# Patient Record
Sex: Female | Born: 1975 | Race: Black or African American | Hispanic: No | Marital: Married | Smoking: Never smoker
Health system: Southern US, Community
[De-identification: ages and names within clinical notes are randomized; demographics above are authoritative.]

## PROBLEM LIST (undated history)

## (undated) DIAGNOSIS — M1990 Unspecified osteoarthritis, unspecified site: Secondary | ICD-10-CM

## (undated) DIAGNOSIS — I10 Essential (primary) hypertension: Secondary | ICD-10-CM

---

## 1997-11-17 ENCOUNTER — Inpatient Hospital Stay (HOSPITAL_COMMUNITY): Admission: AD | Admit: 1997-11-17 | Discharge: 1997-11-17 | Payer: Self-pay | Admitting: Obstetrics

## 1998-01-20 ENCOUNTER — Ambulatory Visit (HOSPITAL_COMMUNITY): Admission: RE | Admit: 1998-01-20 | Discharge: 1998-01-20 | Payer: Self-pay | Admitting: Obstetrics & Gynecology

## 1998-02-02 ENCOUNTER — Encounter: Admission: RE | Admit: 1998-02-02 | Discharge: 1998-02-02 | Payer: Self-pay | Admitting: Family Medicine

## 1998-03-01 ENCOUNTER — Encounter: Admission: RE | Admit: 1998-03-01 | Discharge: 1998-03-01 | Payer: Self-pay | Admitting: Family Medicine

## 1998-03-18 ENCOUNTER — Encounter: Admission: RE | Admit: 1998-03-18 | Discharge: 1998-03-18 | Payer: Self-pay | Admitting: Family Medicine

## 1998-03-25 ENCOUNTER — Ambulatory Visit (HOSPITAL_COMMUNITY): Admission: RE | Admit: 1998-03-25 | Discharge: 1998-03-25 | Payer: Self-pay

## 1998-04-06 ENCOUNTER — Encounter: Admission: RE | Admit: 1998-04-06 | Discharge: 1998-04-06 | Payer: Self-pay | Admitting: Family Medicine

## 1998-04-20 ENCOUNTER — Encounter: Admission: RE | Admit: 1998-04-20 | Discharge: 1998-04-20 | Payer: Self-pay | Admitting: Family Medicine

## 1998-04-26 ENCOUNTER — Encounter: Admission: RE | Admit: 1998-04-26 | Discharge: 1998-04-26 | Payer: Self-pay | Admitting: Family Medicine

## 1998-05-04 ENCOUNTER — Encounter: Admission: RE | Admit: 1998-05-04 | Discharge: 1998-05-04 | Payer: Self-pay | Admitting: Family Medicine

## 1998-05-13 ENCOUNTER — Encounter: Admission: RE | Admit: 1998-05-13 | Discharge: 1998-05-13 | Payer: Self-pay | Admitting: Family Medicine

## 1998-05-16 ENCOUNTER — Encounter: Admission: RE | Admit: 1998-05-16 | Discharge: 1998-05-16 | Payer: Self-pay | Admitting: Family Medicine

## 1998-05-16 ENCOUNTER — Inpatient Hospital Stay (HOSPITAL_COMMUNITY): Admission: AD | Admit: 1998-05-16 | Discharge: 1998-05-16 | Payer: Self-pay | Admitting: Obstetrics

## 1998-05-18 ENCOUNTER — Encounter: Admission: RE | Admit: 1998-05-18 | Discharge: 1998-05-18 | Payer: Self-pay | Admitting: Family Medicine

## 1998-05-18 ENCOUNTER — Inpatient Hospital Stay (HOSPITAL_COMMUNITY): Admission: AD | Admit: 1998-05-18 | Discharge: 1998-05-24 | Payer: Self-pay | Admitting: Obstetrics

## 1998-05-25 ENCOUNTER — Encounter: Admission: RE | Admit: 1998-05-25 | Discharge: 1998-05-25 | Payer: Self-pay | Admitting: Family Medicine

## 1998-07-01 ENCOUNTER — Encounter: Admission: RE | Admit: 1998-07-01 | Discharge: 1998-07-01 | Payer: Self-pay | Admitting: Family Medicine

## 1998-07-22 ENCOUNTER — Encounter: Admission: RE | Admit: 1998-07-22 | Discharge: 1998-07-22 | Payer: Self-pay | Admitting: Family Medicine

## 1998-07-22 ENCOUNTER — Other Ambulatory Visit: Admission: RE | Admit: 1998-07-22 | Discharge: 1998-07-22 | Payer: Self-pay

## 1998-08-11 ENCOUNTER — Encounter: Admission: RE | Admit: 1998-08-11 | Discharge: 1998-08-11 | Payer: Self-pay | Admitting: Family Medicine

## 1999-10-05 ENCOUNTER — Encounter: Admission: RE | Admit: 1999-10-05 | Discharge: 1999-10-05 | Payer: Self-pay | Admitting: Family Medicine

## 2000-09-25 ENCOUNTER — Encounter: Admission: RE | Admit: 2000-09-25 | Discharge: 2000-09-25 | Payer: Self-pay | Admitting: Family Medicine

## 2001-07-24 ENCOUNTER — Encounter: Admission: RE | Admit: 2001-07-24 | Discharge: 2001-07-24 | Payer: Self-pay | Admitting: Family Medicine

## 2001-07-30 ENCOUNTER — Encounter: Admission: RE | Admit: 2001-07-30 | Discharge: 2001-07-30 | Payer: Self-pay | Admitting: Family Medicine

## 2001-09-26 ENCOUNTER — Encounter: Admission: RE | Admit: 2001-09-26 | Discharge: 2001-09-26 | Payer: Self-pay | Admitting: Family Medicine

## 2001-11-14 ENCOUNTER — Encounter: Admission: RE | Admit: 2001-11-14 | Discharge: 2001-11-14 | Payer: Self-pay | Admitting: Family Medicine

## 2002-03-23 ENCOUNTER — Encounter: Admission: RE | Admit: 2002-03-23 | Discharge: 2002-03-23 | Payer: Self-pay | Admitting: Family Medicine

## 2002-03-25 ENCOUNTER — Encounter: Admission: RE | Admit: 2002-03-25 | Discharge: 2002-03-25 | Payer: Self-pay | Admitting: Family Medicine

## 2002-04-24 ENCOUNTER — Encounter: Admission: RE | Admit: 2002-04-24 | Discharge: 2002-04-24 | Payer: Self-pay | Admitting: Family Medicine

## 2002-05-12 ENCOUNTER — Encounter: Payer: Self-pay | Admitting: Sports Medicine

## 2002-05-12 ENCOUNTER — Encounter: Admission: RE | Admit: 2002-05-12 | Discharge: 2002-05-12 | Payer: Self-pay | Admitting: *Deleted

## 2002-05-12 ENCOUNTER — Encounter: Admission: RE | Admit: 2002-05-12 | Discharge: 2002-05-12 | Payer: Self-pay | Admitting: Sports Medicine

## 2002-05-15 ENCOUNTER — Encounter: Admission: RE | Admit: 2002-05-15 | Discharge: 2002-05-15 | Payer: Self-pay | Admitting: Family Medicine

## 2003-04-07 ENCOUNTER — Other Ambulatory Visit: Admission: RE | Admit: 2003-04-07 | Discharge: 2003-04-07 | Payer: Self-pay | Admitting: Family Medicine

## 2003-04-07 ENCOUNTER — Encounter: Admission: RE | Admit: 2003-04-07 | Discharge: 2003-04-07 | Payer: Self-pay | Admitting: Family Medicine

## 2003-04-08 ENCOUNTER — Encounter: Admission: RE | Admit: 2003-04-08 | Discharge: 2003-04-08 | Payer: Self-pay | Admitting: Family Medicine

## 2003-09-03 ENCOUNTER — Encounter: Admission: RE | Admit: 2003-09-03 | Discharge: 2003-09-03 | Payer: Self-pay | Admitting: Sports Medicine

## 2003-09-06 ENCOUNTER — Encounter: Admission: RE | Admit: 2003-09-06 | Discharge: 2003-09-06 | Payer: Self-pay | Admitting: Family Medicine

## 2004-08-01 ENCOUNTER — Ambulatory Visit: Payer: Self-pay | Admitting: Sports Medicine

## 2004-08-03 ENCOUNTER — Ambulatory Visit: Payer: Self-pay | Admitting: Family Medicine

## 2004-12-15 ENCOUNTER — Ambulatory Visit: Payer: Self-pay | Admitting: Family Medicine

## 2005-02-23 ENCOUNTER — Emergency Department (HOSPITAL_COMMUNITY): Admission: EM | Admit: 2005-02-23 | Discharge: 2005-02-23 | Payer: Self-pay | Admitting: Family Medicine

## 2005-05-18 ENCOUNTER — Ambulatory Visit: Payer: Self-pay | Admitting: Family Medicine

## 2005-05-18 ENCOUNTER — Other Ambulatory Visit: Admission: RE | Admit: 2005-05-18 | Discharge: 2005-05-18 | Payer: Self-pay | Admitting: Family Medicine

## 2005-05-20 ENCOUNTER — Encounter (INDEPENDENT_AMBULATORY_CARE_PROVIDER_SITE_OTHER): Payer: Self-pay | Admitting: *Deleted

## 2005-05-20 LAB — CONVERTED CEMR LAB

## 2005-06-25 ENCOUNTER — Ambulatory Visit: Payer: Self-pay | Admitting: Family Medicine

## 2006-03-04 ENCOUNTER — Ambulatory Visit: Payer: Self-pay | Admitting: Family Medicine

## 2006-05-30 ENCOUNTER — Ambulatory Visit: Payer: Self-pay | Admitting: Family Medicine

## 2006-06-03 ENCOUNTER — Ambulatory Visit: Payer: Self-pay | Admitting: Family Medicine

## 2006-12-12 DIAGNOSIS — E669 Obesity, unspecified: Secondary | ICD-10-CM | POA: Insufficient documentation

## 2006-12-13 ENCOUNTER — Encounter (INDEPENDENT_AMBULATORY_CARE_PROVIDER_SITE_OTHER): Payer: Self-pay | Admitting: *Deleted

## 2010-10-13 ENCOUNTER — Encounter: Payer: Self-pay | Admitting: Family Medicine

## 2010-10-13 ENCOUNTER — Ambulatory Visit: Payer: Self-pay | Admitting: Family Medicine

## 2010-10-13 DIAGNOSIS — L83 Acanthosis nigricans: Secondary | ICD-10-CM | POA: Insufficient documentation

## 2010-10-13 DIAGNOSIS — L259 Unspecified contact dermatitis, unspecified cause: Secondary | ICD-10-CM | POA: Insufficient documentation

## 2010-10-13 LAB — CONVERTED CEMR LAB
BUN: 13 mg/dL (ref 6–23)
CO2: 26 meq/L (ref 19–32)
Calcium: 9 mg/dL (ref 8.4–10.5)
Chloride: 105 meq/L (ref 96–112)
Cholesterol: 201 mg/dL — ABNORMAL HIGH (ref 0–200)
Creatinine, Ser: 0.62 mg/dL (ref 0.40–1.20)
Glucose, Bld: 77 mg/dL (ref 70–99)
HCT: 34.7 % — ABNORMAL LOW (ref 36.0–46.0)
HDL: 74 mg/dL (ref >39)
Hemoglobin: 10.7 g/dL — ABNORMAL LOW (ref 12.0–15.0)
LDL Cholesterol: 114 mg/dL — ABNORMAL HIGH (ref 0–99)
MCHC: 30.8 g/dL (ref 30.0–36.0)
MCV: 79.8 fL (ref 78.0–100.0)
Platelets: 308 10*3/uL (ref 150–400)
Potassium: 3.8 meq/L (ref 3.5–5.3)
RBC: 4.35 M/uL (ref 3.87–5.11)
RDW: 16.8 % — ABNORMAL HIGH (ref 11.5–15.5)
Sodium: 141 meq/L (ref 135–145)
Total CHOL/HDL Ratio: 2.7
Triglycerides: 66 mg/dL (ref <150)
VLDL: 13 mg/dL (ref 0–40)
WBC: 10.3 10*3/uL (ref 4.0–10.5)

## 2010-10-17 ENCOUNTER — Encounter: Payer: Self-pay | Admitting: Family Medicine

## 2010-11-16 NOTE — Assessment & Plan Note (Signed)
Summary: NEW PT/PHYSICAL/[facility]    Appended Document: NEW PT/PHYSICAL/[facility]     Vital Signs:  Patient profile:   35 year old female Height:      61 inches Weight:      257 pounds BMI:     48.74 Temp:     98.9 degrees F oral Pulse rate:   74 / minute BP sitting:   153 / 80  Vitals Entered By: Tessie Fass CMA (October 13, 2010 3:52 PM) CC: NEW PT   Visit Type:  office visit Primary Provider:  Edd Arbour  CC:  NEW PT.  History of Present Illness: 1. obesity gave 25 minutes of nutritional counselling advised to exercise. will check a random blood sugar.  2. HTN one reading, patient says she was nervous, no previous history of HTN. She will check her pressures and see me again in 3 monthes.  3. contraception uses condoms with boyfriend of 4 monthes. she would be a good candidate for a copper IUD, she will think about it and see me in 3 monthes.  4. acanthosis nigricans/obesity/family history of DM no polydipsia or polyuria. will check spot glucose. obesity/family history.  5. Eczema left wrist, may be contact dermatitis from watch, will trial a steroid ointment.  Problems Prior to Update: 1)  Obesity, Nos  (ICD-278.00)  Current Problems (verified): 1)  Health Maintenance Exam  (ICD-V70.0) 2)  Obesity, Nos  (ICD-278.00)  Medications Prior to Update: 1)  None  Current Medications (verified): 1)  Hydrocortisone 2.5 % Crea (Hydrocortisone) .... Use On Affected Areas Twice A Day  Allergies (verified): No Known Drug Allergies  Past History:  Family History: Last updated: 12/12/2006 breast CA-aunt, DM-GF, Father-HTN, Mpther-HTN, hyperlipidemia  Social History: Last updated: 10/13/2010 also see her son, [place]; in school for nursing at [employer], currently  sexually active; social EtOH. no tobacco; lives w/her mom Drug use-no Regular exercise-no  Risk Factors: Exercise: no (10/13/2010)  Social History: also see her son, [place]; in school for nursing at  [employer], currently  sexually active; social EtOH. no tobacco; lives w/her mom Drug use-no Regular exercise-no Drug Use:  no Does Patient Exercise:  no  Review of Systems       see HPI  Physical Exam  General:  Obese, aaf, nad, aox3 Head:  Normocephalic and atraumatic without obvious abnormalities. No apparent alopecia or balding. right side of face has a birthmark Eyes:  No corneal or conjunctival inflammation noted. EOMI. Perrla. Funduscopic exam benign, without hemorrhages, exudates or papilledema. Vision grossly normal. Neck:  acanthosis nigricans Chest Wall:  No deformities, masses, or tenderness noted. Lungs:  Normal respiratory effort, chest expands symmetrically. Lungs are clear to auscultation, no crackles or wheezes. Heart:  Normal rate and regular rhythm. S1 and S2 normal without gallop, murmur, click, rub or other extra sounds. Abdomen:  Bowel sounds positive,abdomen soft and non-tender without masses, organomegaly or hernias noted. Pulses:  R and L carotid,radial,femoral,dorsalis pedis and posterior tibial pulses are full and equal bilaterally   Impression & Recommendations:  Problem # 1:  HEALTH MAINTENANCE EXAM (ICD-V70.0) other than obesity she is living a healthy lifestyle.  Orders: CBC-[facility] ([address]) Basic Met-[facility] (60454-09811)  Problem # 2:  ACANTHOSIS NIGRICANS (ICD-701.2) will eval. for DM2  Problem # 3:  OBESITY, NOS (ICD-278.00) advised to diet/exercise  Orders: Lipid-[facility] (91478-29562) CBC-[facility] (13086)  Problem # 4:  CONTACT DERMATITIS&OTHER ECZEMA DUE UNSPEC CAUSE (ICD-692.9)  Her updated medication list for this problem includes:    Hydrocortisone 2.5 % Crea (Hydrocortisone) .Marland KitchenMarland KitchenMarland KitchenMarland Kitchen  Use on affected areas twice a day  Her updated medication list for this problem includes:    Hydrocortisone 2.5 % Crea (Hydrocortisone) ..... Use on affected areas twice a day  Complete Medication List: 1)  Hydrocortisone 2.5 % Crea (Hydrocortisone) .... Use on affected  areas twice a day  Patient Instructions: 1)  Please schedule a follow-up appointment in 3 months. 2)  We will perform a PAP smear at that time 3)  I will send you a letter with the results of today's labs 4)  Check your Blood Pressure regularly. If it is above: you should make an appointment. 5)  Eat lots of protein, little carbs. walnuts are great! eat two chicken breasts for dinner instead of rice, potatoes, pasta, bread. 6)  Great meeting you! Prescriptions: HYDROCORTISONE 2.5 % CREA (HYDROCORTISONE) use on affected areas twice a day  #1 x 1   Entered and Authorized by:   Edd Arbour   Signed by:   Edd Arbour on 10/13/2010   Method used:   Electronically to        [facility] [phone]* (retail)       [address]       [address]       [place], [place]  [address]       Ph: [phone]       Fax: [phone]   RxID:   [id number]    Orders Added: 1)  [facility]- New 18-72yrs [[address]385] 2)  Lipid-[facility] [80061-22930] 3)  CBC-[facility] [85027] 4)  Basic Met-[facility] [[id number]]

## 2010-11-16 NOTE — Letter (Signed)
Summary: Generic Letter  [facility]  [address]   [place], [place] [address]   Phone: [phone]  Fax: [phone]    10/17/2010 MRN: [id number]  [address] [place], [place]  [address]  Dear Ms. Hoobler,  Your labs came back. Your glucose level is normal, which means it is unlikely that you have diabetes. Your cholesterol and LDL levels were a little high, but not at a level where you need medication.  Make sure to eat healthy options whenever you have a chance!!  happy new year!!   Sincerely,   Edd Arbour MD [facility]  Appended Document: Generic Letter mailed letter to pt/

## 2010-12-26 ENCOUNTER — Ambulatory Visit: Payer: Self-pay | Admitting: Family Medicine

## 2011-01-23 ENCOUNTER — Encounter: Payer: Self-pay | Admitting: Family Medicine

## 2011-01-23 ENCOUNTER — Ambulatory Visit (INDEPENDENT_AMBULATORY_CARE_PROVIDER_SITE_OTHER): Payer: BC Managed Care – PPO | Admitting: Family Medicine

## 2011-01-23 ENCOUNTER — Other Ambulatory Visit (HOSPITAL_COMMUNITY)
Admission: RE | Admit: 2011-01-23 | Discharge: 2011-01-23 | Disposition: A | Discharge status: Home / Self Care | Payer: BC Managed Care – PPO | Source: Ambulatory Visit | Attending: Family Medicine | Admitting: Family Medicine

## 2011-01-23 VITALS — BP 132/80 | HR 87 | Ht 62.0 in | Wt 266.0 lb

## 2011-01-23 DIAGNOSIS — Z01419 Encounter for gynecological examination (general) (routine) without abnormal findings: Secondary | ICD-10-CM | POA: Insufficient documentation

## 2011-01-23 DIAGNOSIS — Z309 Encounter for contraceptive management, unspecified: Secondary | ICD-10-CM | POA: Insufficient documentation

## 2011-01-23 DIAGNOSIS — J302 Other seasonal allergic rhinitis: Secondary | ICD-10-CM

## 2011-01-23 DIAGNOSIS — J309 Allergic rhinitis, unspecified: Secondary | ICD-10-CM

## 2011-01-23 DIAGNOSIS — Z124 Encounter for screening for malignant neoplasm of cervix: Secondary | ICD-10-CM

## 2011-01-23 MED ORDER — NORGESTIMATE-ETH ESTRADIOL 0.25-35 MG-MCG PO TABS: 1.0000 | ORAL_TABLET | Freq: Every day | ORAL | Status: DC

## 2011-01-23 NOTE — Progress Notes (Signed)
  Subjective:    Patient ID: Rhonda Henderson, female    DOB: [birth year], 35 y.o.   MRN: [id number] 1. Gynecologic Exam The patient's pertinent negatives include no genital itching, genital lesions, genital odor, genital rash, pelvic pain, vaginal bleeding or vaginal discharge. Associated symptoms include headaches. Pertinent negatives include no abdominal pain, back pain, dysuria, fever, flank pain or frequency.   PAP smear performed. Cervix inspected, no sign of lesions.  2. Seasonal Allergies Patient has itchy eyes, sinus congestion and headache associated with sinuses. She has not tried any OTC medication. No other symptoms.  3. Counseling about Contraceptive Management Patient interested in OCP. Advised her about the risk of pregnancy and blood clots. She understood and acknowledged.   Review of Systems  Constitutional: Negative for fever, activity change and appetite change.  Cardiovascular: Negative for chest pain.  Gastrointestinal: Negative for abdominal pain and abdominal distention.  Genitourinary: Negative for dysuria, frequency, flank pain, vaginal discharge and pelvic pain.  Musculoskeletal: Negative for back pain.  Neurological: Positive for headaches. Negative for dizziness.       Objective:   Physical Exam  Pulmonary/Chest: Effort normal and breath sounds normal. No respiratory distress. She has no wheezes.  Genitourinary: Vagina normal. There is no rash, tenderness, lesion or injury on the right labia. There is no rash, tenderness, lesion or injury on the left labia. No erythema, tenderness or bleeding around the vagina. No vaginal discharge found.        Assessment & Plan:  Pt. Is a 35 y/o aaf here for PAP smear. Discussed contraception and seasonal allergies. 1. Sprintec for contraception 2. Will follow PAP results 3. Zyrtec for allergies.

## 2011-01-23 NOTE — Patient Instructions (Signed)
It was nice seeing you.   Remember the risk of blood clots if you smoke while taking birth control pills.  I hope your allergies get better.  Try Zyrtec, Allegra, Claritin.  I will let you know the results of your PAP smear.

## 2011-12-23 ENCOUNTER — Other Ambulatory Visit: Payer: Self-pay | Admitting: Family Medicine

## 2011-12-23 NOTE — Telephone Encounter (Signed)
Refill request

## 2012-01-23 ENCOUNTER — Encounter: Payer: BC Managed Care – PPO | Admitting: Family Medicine

## 2012-02-05 ENCOUNTER — Encounter: Payer: BC Managed Care – PPO | Admitting: Family Medicine

## 2012-04-02 ENCOUNTER — Encounter: Payer: BC Managed Care – PPO | Admitting: Family Medicine

## 2012-04-30 ENCOUNTER — Ambulatory Visit (INDEPENDENT_AMBULATORY_CARE_PROVIDER_SITE_OTHER): Payer: BC Managed Care – PPO | Admitting: Family Medicine

## 2012-04-30 ENCOUNTER — Encounter: Payer: Self-pay | Admitting: Family Medicine

## 2012-04-30 ENCOUNTER — Other Ambulatory Visit: Payer: Self-pay | Admitting: Family Medicine

## 2012-04-30 VITALS — BP 142/76 | Temp 99.0°F | Ht 61.0 in | Wt 262.0 lb

## 2012-04-30 DIAGNOSIS — N63 Unspecified lump in unspecified breast: Secondary | ICD-10-CM

## 2012-04-30 DIAGNOSIS — K219 Gastro-esophageal reflux disease without esophagitis: Secondary | ICD-10-CM | POA: Insufficient documentation

## 2012-04-30 DIAGNOSIS — R928 Other abnormal and inconclusive findings on diagnostic imaging of breast: Secondary | ICD-10-CM

## 2012-04-30 DIAGNOSIS — Z23 Encounter for immunization: Secondary | ICD-10-CM

## 2012-04-30 MED ORDER — ESOMEPRAZOLE MAGNESIUM 20 MG PO CPDR: 20.0000 mg | DELAYED_RELEASE_CAPSULE | Freq: Every day | ORAL | Status: DC

## 2012-04-30 NOTE — Assessment & Plan Note (Signed)
Will order diagnostic mammogram bilateral to rule out breast malignancy. Will call patient with results of mammogram.

## 2012-04-30 NOTE — Patient Instructions (Signed)
Please go to the breast imaging center or [facility] for diagnostic mammogram. Pick up Nexium from your pharmacy and take daily. Schedule follow up appointment with Dr. Rivka Safer in one month. We will call you with results of your mammogram.

## 2012-04-30 NOTE — Assessment & Plan Note (Signed)
Start taking it Nexium daily x1 month. Follow up with PCP if symptoms worsen.

## 2012-04-30 NOTE — Progress Notes (Signed)
  Subjective:     Rhonda Henderson is a 36 y.o. female and is here for a comprehensive physical exam. The patient reports Finding a breast nodule on the left breast, above the nipple. Patient states her aunt passed away of breast cancer in 27. Patient denies any immediate family member diagnosed with breast cancer. Patient has been taking birth control pills for over 2 years. Denies any unintentional weight loss or decreased appetite.   Patient is not due for a Pap smear today.    Additionally, patient complains of acid reflux. She was taking over-the-counter Zantac but says she did not think he was working it was expensive. Patient complains of reflux after eating large meals and spicy foods.  History   Social History  . Marital Status: Married    Spouse Name: N/A    Number of Children: N/A  . Years of Education: N/A   Occupational History  . Not on file.   Social History Main Topics  . Smoking status: Never Smoker   . Smokeless tobacco: Not on file  . Alcohol Use: Not on file  . Drug Use: Not on file  . Sexually Active: Not on file   Other Topics Concern  . Not on file   Social History Narrative  . No narrative on file   Health Maintenance  Topic Date Due  . Influenza Vaccine  07/15/2012  . Pap Smear  01/22/2014  . Tetanus/tdap  04/30/2022    The following portions of the patient's history were reviewed and updated as appropriate: allergies, current medications, past family history, past medical history, past social history, past surgical history and problem list.  Review of Systems Pertinent items are noted in HPI.   Objective:     General appearance: alert, cooperative and no distress Head: Normocephalic, without obvious abnormality, atraumatic Eyes: conjunctivae/corneas clear. PERRL, EOM's intact. Fundi benign. Throat: lips, mucosa, and tongue normal; teeth and gums normal Neck: no adenopathy, supple, symmetrical, trachea midline and thyroid not enlarged,  symmetric, no tenderness/mass/nodules Lungs: clear to auscultation bilaterally Breasts: positive findings: fibrocystic changes on right; left breast: 2 cm round, immobile nodule located on the left upper inner quadrant at 12 o'clock above nipple; no nipple discharge Heart: RRR, no murmur Abdomen: soft, non-tender; bowel sounds normal; no masses,  no organomegaly Extremities: extremities normal, atraumatic, no cyanosis or edema Skin: Skin color, texture, turgor normal. No rashes or lesions    Assessment:    Healthy female exam, except for LT breast nodule.     Plan:   See Problem List.   See After Visit Summary for Counseling Recommendations

## 2012-05-06 ENCOUNTER — Ambulatory Visit
Admission: RE | Admit: 2012-05-06 | Discharge: 2012-05-06 | Disposition: A | Discharge status: Home / Self Care | Payer: BC Managed Care – PPO | Source: Ambulatory Visit | Attending: Family Medicine | Admitting: Family Medicine

## 2012-05-06 DIAGNOSIS — N63 Unspecified lump in unspecified breast: Secondary | ICD-10-CM

## 2012-10-21 ENCOUNTER — Other Ambulatory Visit: Payer: Self-pay | Admitting: Family Medicine

## 2013-01-02 ENCOUNTER — Ambulatory Visit (INDEPENDENT_AMBULATORY_CARE_PROVIDER_SITE_OTHER): Payer: BC Managed Care – PPO | Admitting: Family Medicine

## 2013-01-02 ENCOUNTER — Encounter: Payer: Self-pay | Admitting: Family Medicine

## 2013-01-02 VITALS — BP 171/101 | HR 75 | Temp 98.4°F | Ht 61.0 in | Wt 253.0 lb

## 2013-01-02 DIAGNOSIS — J302 Other seasonal allergic rhinitis: Secondary | ICD-10-CM

## 2013-01-02 DIAGNOSIS — J309 Allergic rhinitis, unspecified: Secondary | ICD-10-CM

## 2013-01-02 DIAGNOSIS — M549 Dorsalgia, unspecified: Secondary | ICD-10-CM

## 2013-01-02 MED ORDER — CYCLOBENZAPRINE HCL 10 MG PO TABS: 10.0000 mg | ORAL_TABLET | Freq: Three times a day (TID) | ORAL | Status: DC | PRN

## 2013-01-02 NOTE — Patient Instructions (Signed)
Thank you for coming in today, it was good to see you Try aleve or ibuprofen for pain I have sent in a muscle relaxer, try at night at first because it can make you drowsy.  Do not drive while taking.  Restart zyrtec for allergies, if symptoms do not improve please return Follow up with Dr. Cristal Ford to discuss your blood pressure.

## 2013-01-04 DIAGNOSIS — M549 Dorsalgia, unspecified: Secondary | ICD-10-CM | POA: Insufficient documentation

## 2013-01-04 NOTE — Assessment & Plan Note (Signed)
Exam is normal today but clear eye discharge and itching would be consistent with allergic symptoms.  Advised to restart zyrtec to see if this is helpful.

## 2013-01-04 NOTE — Progress Notes (Signed)
  Subjective:    Patient ID: Rhonda Henderson, female    DOB: [birth year], 37 y.o.   MRN: [id number]  HPI 1. Allergies:  Reports R eye watering and itching for the past couple of weeks.  Has history of allergies and has had this problem before.  She has had some intermittent nasal congestion.  She denies vision difficulty or eye pain.  She was taking zyrtec previoulsy, however she is not now.   2. Low back pain/Neck pain:  Reports low back pain and neck pain for the past month. 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  Pain is located on sides of lower back and in her neck just under her jaw on the R side.  She denies any radiation of her pain.  She is not using any medication for pain control.     Review of Systems Per HPI    Objective:   Physical Exam  Constitutional: She appears well-nourished. No distress.  HENT:  Head: Normocephalic and atraumatic.  Mouth/Throat: Oropharynx is clear and moist.  Eyes: Conjunctivae and EOM are normal. Pupils are equal, round, and reactive to light. Right eye exhibits no discharge. Left eye exhibits no discharge.  Neck: Neck supple. No thyromegaly present.  Tender area along R SCM   Pulmonary/Chest: Breath sounds normal. No respiratory distress. She has no wheezes.  Musculoskeletal:  Lumbar Paraspinal muscles tender to palpation with mild spasm.  She has no bony tenderness, no step off palpated.  FROM of spine.   Lymphadenopathy:    She has no cervical adenopathy.          Assessment & Plan:

## 2013-01-04 NOTE — Assessment & Plan Note (Signed)
Back and neck pain consistent with strain. Advised stretches, NSAID and will rx flexeril short term.  Follow up if not improving.

## 2013-01-15 ENCOUNTER — Encounter: Payer: Self-pay | Admitting: Family Medicine

## 2013-01-15 ENCOUNTER — Ambulatory Visit (INDEPENDENT_AMBULATORY_CARE_PROVIDER_SITE_OTHER): Payer: BC Managed Care – PPO | Admitting: Family Medicine

## 2013-01-15 VITALS — BP 162/96 | HR 101 | Temp 99.6°F | Ht 61.0 in | Wt 254.2 lb

## 2013-01-15 DIAGNOSIS — M549 Dorsalgia, unspecified: Secondary | ICD-10-CM

## 2013-01-15 DIAGNOSIS — I10 Essential (primary) hypertension: Secondary | ICD-10-CM

## 2013-01-15 DIAGNOSIS — Z309 Encounter for contraceptive management, unspecified: Secondary | ICD-10-CM

## 2013-01-15 MED ORDER — TRAMADOL HCL 50 MG PO TABS: 50.0000 mg | ORAL_TABLET | Freq: Three times a day (TID) | ORAL | Status: DC | PRN

## 2013-01-15 NOTE — Assessment & Plan Note (Signed)
Persistent. Recommend trying Tylenol when necessary and avoidance of NSAIDs with elevated blood pressure. Provided note to return to work with weight lifting limitation less than 20 pounds.

## 2013-01-15 NOTE — Patient Instructions (Addendum)
Make a lab appointment for fasting lab work in next few days/week. Stop taking birth control pills, can be dangerous and raise your BP. Use condoms until you decide on BC method. Best for you would be mirena or implanon.  Make an appointment with nurse for BP check in 2 weeks, that will give time hormones to leave your system. If still over 140/90, will start talking about medicine. Eat less salt. Exercise 30-60 minutes per day. Try tramadol or tylenol (acetaminophen) (650 mg up to 4 times daily).   DASH Diet The DASH diet stands for "Dietary Approaches to Stop Hypertension." It is a healthy eating plan that has been shown to reduce high blood pressure (hypertension) in as little as 14 days, while also possibly providing other significant health benefits. These other health benefits include reducing the risk of breast cancer after menopause and reducing the risk of type 2 diabetes, heart disease, colon cancer, and stroke. Health benefits also include weight loss and slowing kidney failure in patients with chronic kidney disease.  DIET GUIDELINES  Limit salt (sodium). Your diet should contain less than 1500 mg of sodium daily.  Limit refined or processed carbohydrates. Your diet should include mostly whole grains. Desserts and added sugars should be used sparingly.  Include small amounts of heart-healthy fats. These types of fats include nuts, oils, and tub margarine. Limit saturated and trans fats. These fats have been shown to be harmful in the body. CHOOSING FOODS  The following food groups are based on a 2000 calorie diet. See your Registered Dietitian for individual calorie needs. Grains and Grain Products (6 to 8 servings daily)  Eat More Often: Whole-wheat bread, brown rice, whole-grain or wheat pasta, quinoa, popcorn without added fat or salt (air popped).  Eat Less Often: White bread, white pasta, white rice, cornbread. Vegetables (4 to 5 servings daily)  Eat More Often: Fresh,  frozen, and canned vegetables. Vegetables may be raw, steamed, roasted, or grilled with a minimal amount of fat.  Eat Less Often/Avoid: Creamed or fried vegetables. Vegetables in a cheese sauce. Fruit (4 to 5 servings daily)  Eat More Often: All fresh, canned (in natural juice), or frozen fruits. Dried fruits without added sugar. One hundred percent fruit juice ( cup [237 mL] daily).  Eat Less Often: Dried fruits with added sugar. Canned fruit in light or heavy syrup. [employer], Fish, and Poultry (2 servings or less daily. One serving is 3 to 4 oz [85-114 g]).  Eat More Often: Ninety percent or leaner ground beef, tenderloin, sirloin. Round cuts of beef, chicken breast, [place] breast. All fish. Grill, bake, or broil your meat. Nothing should be fried.  Eat Less Often/Avoid: Fatty cuts of meat, [place], or chicken leg, thigh, or wing. Fried cuts of meat or fish. Dairy (2 to 3 servings)  Eat More Often: Low-fat or fat-free milk, low-fat plain or light yogurt, reduced-fat or part-skim cheese.  Eat Less Often/Avoid: Milk (whole, 2%).Whole milk yogurt. Full-fat cheeses. Nuts, Seeds, and Legumes (4 to 5 servings per week)  Eat More Often: All without added salt.  Eat Less Often/Avoid: Salted nuts and seeds, canned beans with added salt. Fats and Sweets (limited)  Eat More Often: Vegetable oils, tub margarines without trans fats, sugar-free gelatin. Mayonnaise and salad dressings.  Eat Less Often/Avoid: Coconut oils, palm oils, butter, stick margarine, cream, half and half, cookies, candy, pie. FOR MORE INFORMATION The Dash Diet Eating Plan: www.dashdiet.org Document Released: 09/20/2011 Document Revised: 12/24/2011 Document Reviewed: 09/20/2011 Exit[employer] Patient  Information 2013 [place], [place].

## 2013-01-15 NOTE — Progress Notes (Signed)
  Subjective:    Patient ID: Rhonda Henderson, female    DOB: 05/26/1976, 37 y.o.   MRN: [id number]  Back Pain    1. Elevated BP.  BP Readings from Last 3 Encounters:  01/15/13 162/96  01/02/13 171/101  04/30/12 142/76   Patient follows up for elevated blood pressure diagnosed at a recent visit for URI. No diagnosis of hypertension in the past. States that she knows this is elevated recently due to lots of stress in her life including planning for her wedding, plans to move soon, her father having prostate issues and concern for possible cancer. Despite this, we review her BP values over the past 3 years showing some borderline elevation in systolic pressure dating back to 2011.  She remains obese, but has fluctuated down about 10 pounds over the past year. She is taking oral contraceptive pills for the past 2 years. She is taking an antihistamine for allergies recently. She endorses taking NSAIDs one day within the past 2 weeks, though not regularly.  She does have a family history of hypertension and diabetes in her mother and father.  2. Back pain. 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 She requests a note for weight limitation of less than 20 pounds today. Additionally the Flexeril did not help and she is wondering about different medication to use.  Review of Systems  Musculoskeletal: Positive for back pain.   Patient denies chest pain, dyspnea, cough, edema, abdominal pain, weakness, rash, urinary changes, hematuria.    Objective:   Physical Exam  Vitals reviewed. Constitutional: She is oriented to person, place, and time. She appears well-developed and well-nourished. No distress.  HENT:  Head: Normocephalic and atraumatic.  Mouth/Throat: Oropharynx is clear and moist.  Eyes: EOM are normal.  Neck: Normal range of motion. Neck supple. No thyromegaly present.  Cardiovascular: Normal rate, regular  rhythm and normal heart sounds.   No murmur heard. Pulmonary/Chest: Effort normal and breath sounds normal. No respiratory distress. She has no wheezes. She has no rales.  Musculoskeletal: She exhibits no edema and no tenderness.  Lymphadenopathy:    She has no cervical adenopathy.  Neurological: She is alert and oriented to person, place, and time.  Skin: She is not diaphoretic.  Psychiatric: She has a normal mood and affect.       Assessment & Plan:

## 2013-01-15 NOTE — Assessment & Plan Note (Signed)
Most likely new diagnosis of hypertension given her obesity and strong family history. Possible secondary causes are medications including OCPs, NSAID, antihistamine. Recommend she stop his medications and followup for blood pressure check in 2 weeks. Will check fasting labs. Goal of less than 140/90 unless she has diabetes. If patient remains above goal Will consider initiating hydrochlorothiazide. If blood pressure achieves goal, will not start anti-hypertensives.  Discussed briefly DASH diet principles, increase exercise walking. F/u in 2 weeks.

## 2013-01-15 NOTE — Assessment & Plan Note (Signed)
Recommend discontinuing combined oral contraceptive with elevated blood pressure. Discussed various options, the safest being Mirena IUD. Provided information today. Advised patient to followup if/when she decides to pursue this. She agrees to use condoms and abstinence while awaiting new contraceptive method, as she does not desire pregnancy. She refuses a depo shot today.

## 2013-01-29 ENCOUNTER — Other Ambulatory Visit (INDEPENDENT_AMBULATORY_CARE_PROVIDER_SITE_OTHER): Payer: BC Managed Care – PPO

## 2013-01-29 ENCOUNTER — Ambulatory Visit (INDEPENDENT_AMBULATORY_CARE_PROVIDER_SITE_OTHER): Payer: BC Managed Care – PPO | Admitting: *Deleted

## 2013-01-29 ENCOUNTER — Telehealth: Payer: Self-pay | Admitting: Family Medicine

## 2013-01-29 VITALS — BP 138/84 | HR 88

## 2013-01-29 DIAGNOSIS — I10 Essential (primary) hypertension: Secondary | ICD-10-CM

## 2013-01-29 LAB — CBC
HCT: 37.2 % (ref 36.0–46.0)
Hemoglobin: 12 g/dL (ref 12.0–15.0)
MCH: 26.1 pg (ref 26.0–34.0)
MCHC: 32.3 g/dL (ref 30.0–36.0)
MCV: 80.9 fL (ref 78.0–100.0)
Platelets: 284 10*3/uL (ref 150–400)
RBC: 4.6 MIL/uL (ref 3.87–5.11)
RDW: 16.1 % — ABNORMAL HIGH (ref 11.5–15.5)
WBC: 7.9 10*3/uL (ref 4.0–10.5)

## 2013-01-29 LAB — POCT GLYCOSYLATED HEMOGLOBIN (HGB A1C): Hemoglobin A1C: 5.2

## 2013-01-29 LAB — COMPREHENSIVE METABOLIC PANEL
ALT: 13 U/L (ref 0–35)
AST: 15 U/L (ref 0–37)
Albumin: 3.7 g/dL (ref 3.5–5.2)
Alkaline Phosphatase: 66 U/L (ref 39–117)
BUN: 12 mg/dL (ref 6–23)
CO2: 28 mEq/L (ref 19–32)
Calcium: 9.3 mg/dL (ref 8.4–10.5)
Chloride: 101 mEq/L (ref 96–112)
Creat: 0.62 mg/dL (ref 0.50–1.10)
Glucose, Bld: 84 mg/dL (ref 70–99)
Potassium: 3.6 mEq/L (ref 3.5–5.3)
Sodium: 138 mEq/L (ref 135–145)
Total Bilirubin: 0.4 mg/dL (ref 0.3–1.2)
Total Protein: 7.3 g/dL (ref 6.0–8.3)

## 2013-01-29 LAB — POCT URINALYSIS DIPSTICK
Bilirubin, UA: NEGATIVE
Glucose, UA: NEGATIVE
Ketones, UA: NEGATIVE
Leukocytes, UA: NEGATIVE
Nitrite, UA: NEGATIVE
Protein, UA: NEGATIVE
Spec Grav, UA: >=1.03
Urobilinogen, UA: 0.2
pH, UA: 6

## 2013-01-29 LAB — LIPID PANEL
Cholesterol: 203 mg/dL — ABNORMAL HIGH (ref 0–200)
HDL: 77 mg/dL (ref >39)
LDL Cholesterol: 113 mg/dL — ABNORMAL HIGH (ref 0–99)
Total CHOL/HDL Ratio: 2.6 Ratio
Triglycerides: 66 mg/dL (ref <150)
VLDL: 13 mg/dL (ref 0–40)

## 2013-01-29 LAB — POCT UA - MICROSCOPIC ONLY

## 2013-01-29 LAB — TSH: TSH: 1.354 u[IU]/mL (ref 0.350–4.500)

## 2013-01-29 NOTE — Progress Notes (Signed)
Patient here today for BP check.  BP was checked manually.  Gaylene Brooks, RN

## 2013-01-29 NOTE — Progress Notes (Signed)
TSH,CMP,CBC,FLP,A1C AND UA DONE TODAY Rhonda Henderson

## 2013-01-29 NOTE — Telephone Encounter (Signed)
BP down to normal range off OCPs and decongestants. No need for any BP meds. Needs a plan for contraception, recommend IUD. She wants to check with insurance about coverage first. She agrees to make appointment when she decides.

## 2013-01-30 ENCOUNTER — Encounter: Payer: Self-pay | Admitting: Family Medicine

## 2013-06-26 ENCOUNTER — Other Ambulatory Visit: Payer: Self-pay | Admitting: Family Medicine

## 2013-08-16 ENCOUNTER — Other Ambulatory Visit: Payer: Self-pay | Admitting: Family Medicine

## 2013-09-11 ENCOUNTER — Other Ambulatory Visit: Payer: Self-pay | Admitting: Family Medicine

## 2013-10-05 ENCOUNTER — Ambulatory Visit (INDEPENDENT_AMBULATORY_CARE_PROVIDER_SITE_OTHER): Payer: BC Managed Care – PPO | Admitting: Family Medicine

## 2013-10-05 VITALS — BP 160/78 | HR 88 | Temp 99.4°F | Ht 61.0 in | Wt 259.0 lb

## 2013-10-05 DIAGNOSIS — B070 Plantar wart: Secondary | ICD-10-CM

## 2013-10-05 MED ORDER — SALICYLIC ACID 17 % EX GEL: Freq: Every day | CUTANEOUS | Status: DC

## 2013-10-06 DIAGNOSIS — B070 Plantar wart: Secondary | ICD-10-CM | POA: Insufficient documentation

## 2013-10-06 NOTE — Assessment & Plan Note (Signed)
Heel pain did I think is from a plantar wart. We did liquid nitrogen freezes today. I give her some salicylic acid to apply to the calloused area on her heel. We'll see her back in 3-4 weeks portion likely need a second freeze.

## 2013-10-06 NOTE — Progress Notes (Signed)
   Subjective:    Patient ID: Rhonda Henderson, female    DOB: [birth year], 37 y.o.   MRN: [id number]  HPI Left heel pain for the last 2 weeks. It is intermittent not present all the time. Worse after she stands for long period time. 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 Touching the area is painful. No foot injury. No prior history of problems with the foot. PERTINENT  PMH / PSH: She's not diabetic.   Review of Systems No fever. No paresthesias in her foot. No lesions noted on her foot.    Objective:   Physical Exam   Vital signs are reviewed GENERAL: Well-developed overweight female no acute distress FEET: Left. Significant callus on the left heel but the callus is smooth without cracks. There is a very small 2-3 mm area that appears to be a plantars wart which is right at the area she indicates that is painful. PROCEDURE NOTE: After informed consent was given and a appropriate timeout taken, 3 three  second liquid nitrogen freezes were applied to the plantar wart area. Patient chart seizure well.     Assessment & Plan:

## 2013-10-12 ENCOUNTER — Other Ambulatory Visit: Payer: Self-pay | Admitting: Family Medicine

## 2013-10-19 ENCOUNTER — Other Ambulatory Visit: Payer: Self-pay

## 2013-10-19 DIAGNOSIS — Z1231 Encounter for screening mammogram for malignant neoplasm of breast: Secondary | ICD-10-CM

## 2013-11-06 ENCOUNTER — Ambulatory Visit
Admission: RE | Admit: 2013-11-06 | Discharge: 2013-11-06 | Disposition: A | Discharge status: Home / Self Care | Payer: BC Managed Care – PPO | Source: Ambulatory Visit

## 2013-11-06 DIAGNOSIS — Z1231 Encounter for screening mammogram for malignant neoplasm of breast: Secondary | ICD-10-CM

## 2013-11-12 ENCOUNTER — Ambulatory Visit (INDEPENDENT_AMBULATORY_CARE_PROVIDER_SITE_OTHER): Payer: BC Managed Care – PPO | Admitting: Family Medicine

## 2013-11-12 ENCOUNTER — Encounter: Payer: Self-pay | Admitting: Family Medicine

## 2013-11-12 VITALS — BP 158/82 | HR 89 | Temp 99.3°F | Ht 61.0 in | Wt 261.0 lb

## 2013-11-12 DIAGNOSIS — B070 Plantar wart: Secondary | ICD-10-CM

## 2013-11-12 NOTE — Progress Notes (Signed)
   Subjective:    Patient ID: Rhonda Henderson, female    DOB: Jul 15, 1976, 38 y.o.   MRN: [id number]  HPI  F/u to plantar wart: Patient was seen on December 22, just a little over a month ago, for lt foot plantar wart. She reports pain in the area, especially with standing all work, she she takes 800 mg of Advil in the morning. He seems to help her to work without pain. Patient was giving when she of cryotherapy to the plantar wart and tolerated well. She states she's been placing salicylate acid as directed to the area daily. She has noticed a small area of indentation in the center of the prior callused area. No redness  or signs of infection per patient, no drainage.  Review of Systems Negative, with the exception of above mentioned in HPI     Objective:   Physical Exam BP 158/82  Pulse 89  Temp(Src) 99.3 F (37.4 C) (Oral)  Ht 5\' 1"  (1.549 m)  Wt 261 lb (118.389 kg)  BMI 49.34 kg/m2  left foot: Left foot healed with callus formation. The center of callus is indentation worsen or if plantar wart is located. No erythema, drainage or swelling.    Procedure Note: Cryotherapy plantar Wart, with callus removal  Consent: Risks and benefits of therapy discussed with patient who voices understanding and agrees with planned care. No barriers to communication or understanding identified. After obtaining informed consent, the patient's identity, procedure, and site were verified during a pause prior to proceeding with the minor surgical procedure as per universal protocol recommendations. Plantar wart was treated with shave excision of overlying keratin and then cryocauterized with freeze thaw freeze technique with 2-3 mm surround freeze. Local care discussed. Side effects of treatment and precautions discussed. All questions answered. To follow up if worse or any new problems.

## 2013-11-12 NOTE — Assessment & Plan Note (Signed)
Ibuprofen recommend for pain. Dosing dicussed. Shaved off callus formation and lightly curretteged a portion of the wart. Pt tolerated well.  Preformed #2 nitrogen for 5 seconds.  Pt to continue SA Pt to return in 3-4 weeks. Possible third attempt will be needed, pt aware.

## 2013-11-12 NOTE — Patient Instructions (Signed)
Plantar Wart Warts are benign (noncancerous) growths of the outer skin layer. They can occur at any time in life but are most common during childhood and the teen years. Warts can occur on many skin surfaces of the body. When they occur on the underside (sole) of your foot they are called plantar warts. They often emerge in groups with several small warts encircling a larger growth. CAUSES  Human papillomavirus (HPV) is the cause of plantar warts. HPV attacks a break in the skin of the foot. Walking barefoot can lead to exposure to the wart virus. Plantar warts tend to develop over areas of pressure such as the heel and ball of the foot. Plantar warts often grow into the deeper layers of skin. They may spread to other areas of the sole but cannot spread to other areas of the body. SYMPTOMS  You may also notice a growth on the undersurface of your foot. The wart may grow directly into the sole of the foot, or rise above the surface of the skin on the sole of the foot, or both. They are most often flat from pressure. Warts generally do not cause itching but may cause pain in the area of the wart when you put weight on your foot. DIAGNOSIS  Diagnosis is made by physical examination. This means your caregiver discovers it while examining your foot.  TREATMENT  There are many ways to treat plantar warts. However, warts are very tough. Sometimes it is difficult to treat them so that they go away completely and do not grow back. Any treatment must be done regularly to work. If left untreated, most plantar warts will eventually disappear over a period of one to two years. Treatments you can do at home include:  Putting duct tape over the top of the wart (occlusion), has been found to be effective over several months. The duct tape should be removed each night and reapplied until the wart has disappeared.  Placing over-the-counter medications on top of the wart to help kill the wart virus and remove the wart  tissue (salicylic acid, cantharidin, and dichloroacetic acid ) are useful. These are called keratolytic agents. These medications make the skin soft and gradually layers will shed away. Theses compounds are usually placed on the wart each night and then covered with a band-aid. They are also available in pre-medicated band-aid form. Avoid surrounding skin when applying these liquids as these medications can burn healthy skin. The treatment may take several months of nightly use to be effective.  Cryotherapy to freeze the wart has recently become available over-the-counter for children 4 years and older. This system makes use of a soft narrow applicator connected to a bottle of compressed cold liquid that is applied directly to the wart. This medication can burn health skin and should be used with caution.  As with all over-the-counter medications, read the directions carefully before use. Treatments generally done in your caregiver's office include:  Some aggressive treatments may cause discomfort, discoloration and scaring of the surrounding skin. The risks and benefits of treatment should be discussed with your caregiver.  Freezing the wart with liquid nitrogen (cryotherapy, see above).  Burning the wart with use of very high heat (cautery).  Injecting medication into the wart.  Surgically removing or laser treatment of the wart.  Your caregiver may refer you to a dermatologist for difficult to treat, large sized or large numbers of warts. HOME CARE INSTRUCTIONS   Soak the affected area in warm water. Dry   the area completely when you are done. Remove the top layer of softened skin, then apply the chosen topical medication and reapply a bandage.  Remove the bandage daily and file excess wart tissue (pumice stone works well for this purpose). Repeat the entire process daily or every other day for weeks until the plantar wart disappears.  Several brands of salicylic acid pads are available as  over-the-counter remedies.  Pain can be relieved by wearing a doughnut bandage. This is a bandage with a hole in it. The bandage is put on with the hole over the wart. This helps take the pressure off the wart and gives pain relief. To help prevent plantar warts:  Wear shoes and socks and change them daily.  Keep feet clean and dry.  Check your feet and your children's feet regularly.  Avoid direct contact with warts on other people.  Have growths, or changes on your skin checked by your caregiver. Document Released: 12/22/2003 Document Revised: 12/24/2011 Document Reviewed: 06/01/2009 [employer] Patient Information 2014 [employer].  

## 2013-12-11 ENCOUNTER — Encounter: Payer: Self-pay | Admitting: Family Medicine

## 2013-12-11 ENCOUNTER — Ambulatory Visit (INDEPENDENT_AMBULATORY_CARE_PROVIDER_SITE_OTHER): Payer: BC Managed Care – PPO | Admitting: Family Medicine

## 2013-12-11 ENCOUNTER — Other Ambulatory Visit (HOSPITAL_COMMUNITY)
Admission: RE | Admit: 2013-12-11 | Discharge: 2013-12-11 | Disposition: A | Discharge status: Home / Self Care | Payer: BC Managed Care – PPO | Source: Ambulatory Visit | Attending: Family Medicine | Admitting: Family Medicine

## 2013-12-11 VITALS — BP 173/86 | HR 72 | Temp 98.1°F | Ht 61.0 in | Wt 261.8 lb

## 2013-12-11 DIAGNOSIS — D219 Benign neoplasm of connective and other soft tissue, unspecified: Secondary | ICD-10-CM | POA: Insufficient documentation

## 2013-12-11 DIAGNOSIS — Z1151 Encounter for screening for human papillomavirus (HPV): Secondary | ICD-10-CM | POA: Insufficient documentation

## 2013-12-11 DIAGNOSIS — Z309 Encounter for contraceptive management, unspecified: Secondary | ICD-10-CM

## 2013-12-11 DIAGNOSIS — Z01419 Encounter for gynecological examination (general) (routine) without abnormal findings: Secondary | ICD-10-CM | POA: Insufficient documentation

## 2013-12-11 DIAGNOSIS — Z124 Encounter for screening for malignant neoplasm of cervix: Secondary | ICD-10-CM

## 2013-12-11 DIAGNOSIS — B070 Plantar wart: Secondary | ICD-10-CM

## 2013-12-11 DIAGNOSIS — I10 Essential (primary) hypertension: Secondary | ICD-10-CM

## 2013-12-11 DIAGNOSIS — Z0000 Encounter for general adult medical examination without abnormal findings: Secondary | ICD-10-CM

## 2013-12-11 LAB — CBC
HCT: 36.1 % (ref 36.0–46.0)
Hemoglobin: 11.7 g/dL — ABNORMAL LOW (ref 12.0–15.0)
MCH: 26.2 pg (ref 26.0–34.0)
MCHC: 32.4 g/dL (ref 30.0–36.0)
MCV: 80.8 fL (ref 78.0–100.0)
Platelets: 297 10*3/uL (ref 150–400)
RBC: 4.47 MIL/uL (ref 3.87–5.11)
RDW: 15.1 % (ref 11.5–15.5)
WBC: 10.5 10*3/uL (ref 4.0–10.5)

## 2013-12-11 MED ORDER — HYDROCHLOROTHIAZIDE 12.5 MG PO TABS: 25.0000 mg | ORAL_TABLET | Freq: Every day | ORAL | Status: DC

## 2013-12-11 MED ORDER — NORGESTIMATE-ETH ESTRADIOL 0.25-35 MG-MCG PO TABS: 1.0000 | ORAL_TABLET | Freq: Every day | ORAL | Status: DC

## 2013-12-11 NOTE — Patient Instructions (Signed)
Hypertension As your heart beats, it forces blood through your arteries. This force is your blood pressure. If the pressure is too high, it is called hypertension (HTN) or high blood pressure. HTN is dangerous because you may have it and not know it. High blood pressure may mean that your heart has to work harder to pump blood. Your arteries may be narrow or stiff. The extra work puts you at risk for heart disease, stroke, and other problems.  Blood pressure consists of two numbers, a higher number over a lower, 110/72, for example. It is stated as "110 over 72." The ideal is below 120 for the top number (systolic) and under 80 for the bottom (diastolic). Write down your blood pressure today. You should pay close attention to your blood pressure if you have certain conditions such as:  Heart failure.  Prior heart attack.  Diabetes  Chronic kidney disease.  Prior stroke.  Multiple risk factors for heart disease. To see if you have HTN, your blood pressure should be measured while you are seated with your arm held at the level of the heart. It should be measured at least twice. A one-time elevated blood pressure reading (especially in the Emergency Department) does not mean that you need treatment. There may be conditions in which the blood pressure is different between your right and left arms. It is important to see your caregiver soon for a recheck. Most people have essential hypertension which means that there is not a specific cause. This type of high blood pressure may be lowered by changing lifestyle factors such as:  Stress.  Smoking.  Lack of exercise.  Excessive weight.  Drug/tobacco/alcohol use.  Eating less salt. Most people do not have symptoms from high blood pressure until it has caused damage to the body. Effective treatment can often prevent, delay or reduce that damage. TREATMENT  When a cause has been identified, treatment for high blood pressure is directed at the  cause. There are a large number of medications to treat HTN. These fall into several categories, and your caregiver will help you select the medicines that are best for you. Medications may have side effects. You should review side effects with your caregiver. If your blood pressure stays high after you have made lifestyle changes or started on medicines,   Your medication(s) may need to be changed.  Other problems may need to be addressed.  Be certain you understand your prescriptions, and know how and when to take your medicine.  Be sure to follow up with your caregiver within the time frame advised (usually within two weeks) to have your blood pressure rechecked and to review your medications.  If you are taking more than one medicine to lower your blood pressure, make sure you know how and at what times they should be taken. Taking two medicines at the same time can result in blood pressure that is too low. SEEK IMMEDIATE MEDICAL CARE IF:  You develop a severe headache, blurred or changing vision, or confusion.  You have unusual weakness or numbness, or a faint feeling.  You have severe chest or abdominal pain, vomiting, or breathing problems. MAKE SURE YOU:   Understand these instructions.  Will watch your condition.  Will get help right away if you are not doing well or get worse. Document Released: 10/01/2005 Document Revised: 12/24/2011 Document Reviewed: 05/21/2008 [facility] Patient Information 2014 [place].   Plantar Wart Warts are benign (noncancerous) growths of the outer skin layer. They can  occur at any time in life but are most common during childhood and the teen years. Warts can occur on many skin surfaces of the body. When they occur on the underside (sole) of your foot they are called plantar warts. They often emerge in groups with several small warts encircling a larger growth. CAUSES  Human papillomavirus (HPV) is the cause of plantar warts. HPV attacks a  break in the skin of the foot. Walking barefoot can lead to exposure to the wart virus. Plantar warts tend to develop over areas of pressure such as the heel and ball of the foot. Plantar warts often grow into the deeper layers of skin. They may spread to other areas of the sole but cannot spread to other areas of the body. SYMPTOMS  You may also notice a growth on the undersurface of your foot. The wart may grow directly into the sole of the foot, or rise above the surface of the skin on the sole of the foot, or both. They are most often flat from pressure. Warts generally do not cause itching but may cause pain in the area of the wart when you put weight on your foot. DIAGNOSIS  Diagnosis is made by physical examination. This means your caregiver discovers it while examining your foot.  TREATMENT  There are many ways to treat plantar warts. However, warts are very tough. Sometimes it is difficult to treat them so that they go away completely and do not grow back. Any treatment must be done regularly to work. If left untreated, most plantar warts will eventually disappear over a period of one to two years. Treatments you can do at home include:  Putting duct tape over the top of the wart (occlusion), has been found to be effective over several months. The duct tape should be removed each night and reapplied until the wart has disappeared.  Placing over-the-counter medications on top of the wart to help kill the wart virus and remove the wart tissue (salicylic acid, cantharidin, and dichloroacetic acid ) are useful. These are called keratolytic agents. These medications make the skin soft and gradually layers will shed away. Theses compounds are usually placed on the wart each night and then covered with a band-aid. They are also available in pre-medicated band-aid form. Avoid surrounding skin when applying these liquids as these medications can burn healthy skin. The treatment may take several months of  nightly use to be effective.  Cryotherapy to freeze the wart has recently become available over-the-counter for children 4 years and older. This system makes use of a soft narrow applicator connected to a bottle of compressed cold liquid that is applied directly to the wart. This medication can burn health skin and should be used with caution.  As with all over-the-counter medications, read the directions carefully before use. Treatments generally done in your caregiver's office include:  Some aggressive treatments may cause discomfort, discoloration and scaring of the surrounding skin. The risks and benefits of treatment should be discussed with your caregiver.  Freezing the wart with liquid nitrogen (cryotherapy, see above).  Burning the wart with use of very high heat (cautery).  Injecting medication into the wart.  Surgically removing or laser treatment of the wart.  Your caregiver may refer you to a dermatologist for difficult to treat, large sized or large numbers of warts. HOME CARE INSTRUCTIONS   Soak the affected area in warm water. Dry the area completely when you are done. Remove the top layer of softened  skin, then apply the chosen topical medication and reapply a bandage.  Remove the bandage daily and file excess wart tissue (pumice stone works well for this purpose). Repeat the entire process daily or every other day for weeks until the plantar wart disappears.  Several brands of salicylic acid pads are available as over-the-counter remedies.  Pain can be relieved by wearing a doughnut bandage. This is a bandage with a hole in it. The bandage is put on with the hole over the wart. This helps take the pressure off the wart and gives pain relief. To help prevent plantar warts:  Wear shoes and socks and change them daily.  Keep feet clean and dry.  Check your feet and your children's feet regularly.  Avoid direct contact with warts on other people.  Have growths, or  changes on your skin checked by your caregiver. Document Released: 12/22/2003 Document Revised: 12/24/2011 Document Reviewed: 06/01/2009 [facility] Patient Information 2014 [place].  Fibroids Fibroids are lumps (tumors) that can occur any place in a woman's body. These lumps are not cancerous. Fibroids vary in size, weight, and where they grow. HOME CARE  Do not take aspirin.  Write down the number of pads or tampons you use during your period. Tell your doctor. This can help determine the best treatment for you. GET HELP RIGHT AWAY IF:  You have pain in your lower belly (abdomen) that is not helped with medicine.  You have cramps that are not helped with medicine.  You have more bleeding between or during your period.  You feel lightheaded or pass out (faint).  Your lower belly pain gets worse. MAKE SURE YOU:  Understand these instructions.  Will watch your condition.  Will get help right away if you are not doing well or get worse. Document Released: 11/03/2010 Document Revised: 12/24/2011 Document Reviewed: 11/03/2010 [facility] Patient Information 2014 [place], [place].   It was a pleasure seeing you again. If this does not take the wart away we can always re-treat in a few weeks. Try to keep the callus shaved down, I think this is what causes the majority of your pain. If you decide to wait to be treated again, that is also fine.  I will call you with all your lab results, or if all are normal you will receive copies only via mail.   You do have hypertension and I think we should start medications. You have been elevated since 2012 in the computer.  We will start you on a medication called HCTZ. We will need you to schedule a nurses visit in 2 weeks to see how your pressure is doing. If still irregular we may need to adjust your dose.

## 2013-12-12 LAB — BASIC METABOLIC PANEL
BUN: 16 mg/dL (ref 6–23)
CO2: 26 mEq/L (ref 19–32)
Calcium: 9.2 mg/dL (ref 8.4–10.5)
Chloride: 104 mEq/L (ref 96–112)
Creat: 0.65 mg/dL (ref 0.50–1.10)
Glucose, Bld: 70 mg/dL (ref 70–99)
Potassium: 4 mEq/L (ref 3.5–5.3)
Sodium: 139 mEq/L (ref 135–145)

## 2013-12-12 LAB — LDL CHOLESTEROL, DIRECT: Direct LDL: 111 mg/dL — ABNORMAL HIGH

## 2013-12-12 LAB — TSH: TSH: 2.512 u[IU]/mL (ref 0.350–4.500)

## 2013-12-14 ENCOUNTER — Encounter: Payer: Self-pay | Admitting: Family Medicine

## 2013-12-14 DIAGNOSIS — Z01419 Encounter for gynecological examination (general) (routine) without abnormal findings: Secondary | ICD-10-CM | POA: Insufficient documentation

## 2013-12-14 NOTE — Assessment & Plan Note (Signed)
Patient has attempted diets in the past. She has been elevated for multiple visits since 2012. Will start HCTZ 12.5 today and have her followup for a nurse's visit within a week for BP check. Will need to see her back in 2-4 weeks for hypertension checkup. Patient advised to watch the salt content in her diet.  Patient advised to take her blood pressure couple times a week if possible and report back to me or her BPs are.

## 2013-12-14 NOTE — Assessment & Plan Note (Signed)
Refill medications x11.

## 2013-12-14 NOTE — Assessment & Plan Note (Signed)
Pelvic and Pap completed today will call patient with results once available.

## 2013-12-14 NOTE — Progress Notes (Signed)
   Subjective:    Patient ID: Rhonda Henderson, female    DOB: November 12, 1975, 38 y.o.   MRN: [id number]  HPI  Well women exam:  Rhonda Henderson is 38 y.o. G1 P63 female present for her well woman exam today. She reports no abnormal Paps in the past. Her last Pap smear was April 2012 and was normal. Mammogram January 2015 which also was normal. She reports that her menstrual cycles are pretty regular every 28-30 days, sporadically she'll have a menses for with heavy flow. She has a history of small uterine fibroids. She is currently on birth control pills for contraception. She does not desire any more children. She is not a smoker. She has a family history of breast cancer in her maternal aunt.  Yearly physical: 38 year old female here for her yearly physical. Patient has no complaints. She has not had basic labs and greater than 2 years. At that time she had some mildly elevated lipids. She has not tried diet change. She has been hypertensive on past visits but she has not medicated. She is hypertensive again today.  Plantar wart: Patient here for plantar wart that has been at the bottom of her left foot for approximately 2 months. She has undergone 2 cryotherapy treatments and has been placing salicylate acid on it daily. She feels that she did have some improvement after the last treatment. She feels that if the callus formation it's actually causing her the majority of her pain when she is walking. She rates the pain an 8/10 today. She desires another cryotherapy treatment today.   Review of Systems Negative, with the exception of above mentioned in HPI     Objective:   Physical Exam BP 173/86  Pulse 72  Temp(Src) 98.1 F (36.7 C)  Ht 5\' 1"  (1.549 m)  Wt 261 lb 12.8 oz (118.752 kg)  BMI 49.49 kg/m2  LMP 11/19/2013 Gen: NAD. limping HEENT: AT. [place]. Bilateral TM visualized and normal in appearance. Bilateral eyes without injections or icterus. MMM. Bilateral nares within normal limits. Throat  without erythema or exudates.  CV: RRR  Chest: CTAB, no wheeze or crackles Abd: Soft. Obese . NTND. BS present. No Masses palpated.  Ext: No erythema. No edema. Plantar wart left heel approximately 1 cm in diameter with callus formation over. Little improvement from last visit. Skin: No rashes, purpura or petechiae.  Neuro: Limping due to plantar wart on left heel. PERLA. EOMi. Alert. Grossly intact.   GYN:  External genitalia within normal limits.  Vaginal mucosa pink, moist, normal rugae.  Nonfriable cervix without lesions, no discharge or bleeding noted on speculum exam.  Bimanual exam revealed normal, nongravid uterus.  No cervical motion tenderness. No adnexal masses bilaterally.    Procedure Note:   Cryotherapy plantar Wart, with callus removal  Consent: Risks and benefits of therapy discussed with patient who voices understanding and agrees with planned care. No barriers to communication or understanding identified. After obtaining informed consent, the patient's identity, procedure, and site were verified during a pause prior to proceeding with the minor surgical procedure as per universal protocol recommendations. Plantar wart was treated with shave excision of overlying keratin and then cryocauterized with freeze thaw freeze technique with 2-3 mm surround freeze. Local care discussed. Side effects of treatment and precautions discussed. All questions answered. To follow up if worse or any new problems.

## 2013-12-14 NOTE — Assessment & Plan Note (Signed)
Ibuprofen recommended for pain. Shaped callous formation Performed #3 nitrogen treatment Patient to continue SA Patient tolerated procedure well and advised to turn in 3-4 weeks if she would like to have another cryotherapy treatment

## 2013-12-15 ENCOUNTER — Telehealth: Payer: Self-pay | Admitting: Family Medicine

## 2013-12-15 ENCOUNTER — Encounter: Payer: Self-pay | Admitting: Family Medicine

## 2013-12-15 NOTE — Telephone Encounter (Signed)
Please call  Patient and let her know her PAP was completely normal. Thanks

## 2013-12-15 NOTE — Telephone Encounter (Signed)
Message left on patients voicemail. Rhonda Henderson, Rhonda Henderson

## 2013-12-25 ENCOUNTER — Ambulatory Visit (INDEPENDENT_AMBULATORY_CARE_PROVIDER_SITE_OTHER): Payer: BC Managed Care – PPO | Admitting: *Deleted

## 2013-12-25 VITALS — BP 146/80

## 2013-12-25 DIAGNOSIS — Z136 Encounter for screening for cardiovascular disorders: Secondary | ICD-10-CM

## 2013-12-25 DIAGNOSIS — Z0130 Encounter for examination of blood pressure without abnormal findings: Secondary | ICD-10-CM

## 2013-12-25 NOTE — Progress Notes (Signed)
   Pt in clinic for blood pressure check.  Blood pressure 146/80 manually.  Pt stated she took blood pressure medication this AM as prescribed.  Will forward to provider for review.  Derl Barrow, RN

## 2013-12-29 ENCOUNTER — Telehealth: Payer: Self-pay | Admitting: Family Medicine

## 2013-12-29 DIAGNOSIS — I10 Essential (primary) hypertension: Secondary | ICD-10-CM

## 2013-12-29 MED ORDER — HYDROCHLOROTHIAZIDE 12.5 MG PO TABS: 25.0000 mg | ORAL_TABLET | Freq: Two times a day (BID) | ORAL | Status: DC

## 2013-12-29 NOTE — Telephone Encounter (Signed)
Inform pt I have called in the higher dose of 25 mg. She is to take 25 mg BID. She can use the remainder of her prescription (12.5 mg dose) but double up the dose, until she fills her new script.

## 2013-12-29 NOTE — Telephone Encounter (Signed)
Left message on patients voicemail.Rhonda Henderson, Rhonda Henderson

## 2013-12-29 NOTE — Telephone Encounter (Signed)
Please call Rhonda Henderson and have her take 2 HCTZ, two times a day. This equals 25 mg, two times a day. I will want to see her back in 2 weeks or less to check BP. She should also take her BP at home/local drug store and write those down. Thanks.

## 2013-12-29 NOTE — Addendum Note (Signed)
Addended by: Howard Pouch A on: 12/29/2013 03:14 PM   Modules accepted: Orders

## 2013-12-29 NOTE — Telephone Encounter (Signed)
Relayed message,patient requesting more medication ,not enough for 2 table twice daily.please advise.Maxtyn Nuzum, Lewie Loron

## 2014-01-06 ENCOUNTER — Telehealth: Payer: Self-pay | Admitting: Family Medicine

## 2014-01-06 DIAGNOSIS — I10 Essential (primary) hypertension: Secondary | ICD-10-CM

## 2014-01-06 NOTE — Telephone Encounter (Signed)
Pt called because the dosage was changed on her BP medication to twice a day. When the refill was sent in it only contained 30 qty. Since she is taking two a day she has run out. Cane we send in enough medication to get her to her appointment on 4/9, jw

## 2014-01-07 MED ORDER — HYDROCHLOROTHIAZIDE 12.5 MG PO TABS: 25.0000 mg | ORAL_TABLET | Freq: Two times a day (BID) | ORAL | Status: DC

## 2014-01-07 NOTE — Telephone Encounter (Signed)
Patient was informed. Rhonda Henderson S  

## 2014-01-07 NOTE — Telephone Encounter (Signed)
Change to script completed. Thanks.

## 2014-01-21 ENCOUNTER — Telehealth: Payer: Self-pay | Admitting: Family Medicine

## 2014-01-21 ENCOUNTER — Ambulatory Visit: Payer: BC Managed Care – PPO | Admitting: Family Medicine

## 2014-01-21 DIAGNOSIS — I10 Essential (primary) hypertension: Secondary | ICD-10-CM

## 2014-01-21 MED ORDER — HYDROCHLOROTHIAZIDE 25 MG PO TABS: 25.0000 mg | ORAL_TABLET | Freq: Two times a day (BID) | ORAL | Status: DC

## 2014-01-21 NOTE — Telephone Encounter (Signed)
Patient scheduled to see Dr. Raoul Pitch on today, but due to MD being sick, appt was rescheduled. Patient was coming to get refills on BP meds. Patient states she takes 4 tabs daily and was only prescribed 30 tabs. She is having to get refills every week and half. Patient is requesting at least a 90 day supply of meds so she does not have to go pharmacy as often. Patient rescheduled for 5/14 @ 9:15 am. Please advise.

## 2014-01-21 NOTE — Telephone Encounter (Signed)
Request received. I called in prescription for pt. New script is for the higher dosage we changed her to after her nurse visit. So she is now to take 1 tablet (25mg  tab) twice a day. I have called in 60 tabs with some refills, she is advised to keep her re- scheduled appointment. Please apologize to her for me for having to reschedule. Thank you.

## 2014-01-22 NOTE — Telephone Encounter (Signed)
Patient returns call, informed of MD's msg below.

## 2014-01-22 NOTE — Telephone Encounter (Signed)
LM with patient's mother to call office back.

## 2014-02-25 ENCOUNTER — Encounter: Payer: Self-pay | Admitting: Family Medicine

## 2014-02-25 ENCOUNTER — Ambulatory Visit (INDEPENDENT_AMBULATORY_CARE_PROVIDER_SITE_OTHER): Payer: BC Managed Care – PPO | Admitting: Family Medicine

## 2014-02-25 VITALS — BP 130/92 | HR 88 | Wt 255.0 lb

## 2014-02-25 DIAGNOSIS — I10 Essential (primary) hypertension: Secondary | ICD-10-CM

## 2014-02-25 DIAGNOSIS — B070 Plantar wart: Secondary | ICD-10-CM

## 2014-02-25 NOTE — Progress Notes (Signed)
   Subjective:    Patient ID: Rhonda Henderson, female    DOB: 01/27/1976, 38 y.o.   MRN: [id number]  HPI Rhonda Henderson is a 38 y.o. female presents today for hypertension followup.  Hypertension: Patient was recently started on HCTZ for her new onset hypertension. She had a nurse visit that showed elevated blood pressures despite low-dose HCTZ and her dosage was raised to 25 twice a day. Today her blood pressure is 130/92. 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 She is feeling mildly nervous and anxious today she is getting married on Saturday. She states she has noticed she is urinating more with the HCTZ. She is attempting to try a low-salt diet but is asking for guidance. She states she is eating healthier baking her needs and eating plenty of fruits and vegetables. She is not incorporating exercise in her regimen at this time. He denies chest pain, palpitations, headache, dizziness or swelling of the extremities.  Plantar wart: Of note patient reports that plantar wart that we have been treating with cryotherapy for the last few months has finally dissolved. She is able to walk without difficulty and is in no pain. Review of Systems Negative, with the exception of above mentioned in HPI     Objective:   Physical Exam BP 130/92  Pulse 88  Wt 255 lb (115.667 kg) Gen: Pleasant, obese African American female. No acute distress, nontoxic in appearance. HEENT: AT. [place].  MMM.  CV: RRR Chest: CTAB, no wheeze or crackles Ext: No erythema. No edema.

## 2014-02-25 NOTE — Patient Instructions (Addendum)
2 Gram Low Sodium Diet A 2 gram sodium diet restricts the amount of sodium in the diet to no more than 2 g or 2000 mg daily. Limiting the amount of sodium is often used to help lower blood pressure. It is important if you have heart, liver, or kidney problems. Many foods contain sodium for flavor and sometimes as a preservative. When the amount of sodium in a diet needs to be low, it is important to know what to look for when choosing foods and drinks. The following includes some information and guidelines to help make it easier for you to adapt to a low sodium diet. QUICK TIPS  Do not add salt to food.  Avoid convenience items and fast food.  Choose unsalted snack foods.  Buy lower sodium products, often labeled as "lower sodium" or "no salt added."  Check food labels to learn how much sodium is in 1 serving.  When eating at a restaurant, ask that your food be prepared with less salt or none, if possible. READING FOOD LABELS FOR SODIUM INFORMATION The nutrition facts label is a good place to find how much sodium is in foods. Look for products with no more than 500 to 600 mg of sodium per meal and no more than 150 mg per serving. Remember that 2 g = 2000 mg. The food label may also list foods as:  Sodium-free: Less than 5 mg in a serving.  Very low sodium: 35 mg or less in a serving.  Low-sodium: 140 mg or less in a serving.  Light in sodium: 50% less sodium in a serving. For example, if a food that usually has 300 mg of sodium is changed to become light in sodium, it will have 150 mg of sodium.  Reduced sodium: 25% less sodium in a serving. For example, if a food that usually has 400 mg of sodium is changed to reduced sodium, it will have 300 mg of sodium. CHOOSING FOODS Grains  Avoid: Salted crackers and snack items. Some cereals, including instant hot cereals. Bread stuffing and biscuit mixes. Seasoned rice or pasta mixes.  Choose: Unsalted snack items. Low-sodium cereals, oats,  puffed wheat and rice, shredded wheat. English muffins and bread. Pasta. Meats  Avoid: Salted, canned, smoked, spiced, pickled meats, including fish and poultry. Bacon, ham, sausage, cold cuts, hot dogs, anchovies.  Choose: Low-sodium canned tuna and salmon. Fresh or frozen meat, poultry, and fish. Dairy  Avoid: Processed cheese and spreads. Cottage cheese. Buttermilk and condensed milk. Regular cheese.  Choose: Milk. Low-sodium cottage cheese. Yogurt. Sour cream. Low-sodium cheese. Fruits and Vegetables  Avoid: Regular canned vegetables. Regular canned tomato sauce and paste. Frozen vegetables in sauces. Olives. Pickles. Relishes. Sauerkraut.  Choose: Low-sodium canned vegetables. Low-sodium tomato sauce and paste. Frozen or fresh vegetables. Fresh and frozen fruit. Condiments  Avoid: Canned and packaged gravies. Worcestershire sauce. Tartar sauce. Barbecue sauce. Soy sauce. Steak sauce. Ketchup. Onion, garlic, and table salt. Meat flavorings and tenderizers.  Choose: Fresh and dried herbs and spices. Low-sodium varieties of mustard and ketchup. Lemon juice. Tabasco sauce. Horseradish. SAMPLE 2 GRAM SODIUM MEAL PLAN Breakfast / Sodium (mg)  1 cup low-fat milk / 143 mg  2 slices whole-wheat toast / 270 mg  1 tbs heart-healthy margarine / 153 mg  1 hard-boiled egg / 139 mg  1 small orange / 0 mg Lunch / Sodium (mg)  1 cup raw carrots / 76 mg   cup hummus / 298 mg  1 cup low-fat milk /   143 mg   cup red grapes / 2 mg  1 whole-wheat pita bread / 356 mg Dinner / Sodium (mg)  1 cup whole-wheat pasta / 2 mg  1 cup low-sodium tomato sauce / 73 mg  3 oz lean ground beef / 57 mg  1 small side salad (1 cup raw spinach leaves,  cup cucumber,  cup yellow bell pepper) with 1 tsp olive oil and 1 tsp red wine vinegar / 25 mg Snack / Sodium (mg)  1 container low-fat vanilla yogurt / 107 mg  3 graham cracker squares / 127 mg Nutrient Analysis  Calories: 2033  Protein:  77 g  Carbohydrate: 282 g  Fat: 72 g  Sodium: 1971 mg Document Released: 10/01/2005 Document Revised: 12/24/2011 Document Reviewed: 01/02/2010 [employer] Patient Information 2014 [place], [place].   Reading Food Labels Foods that are in packaging or containers will often have a Nutrition Facts panel on its side or back. The Nutrition Facts panel provides the nutritional value of the food. This information is helpful when determining healthy food choices. By reading food labels, you will find out the serving size of a food and how many servings the package has. You will also find information about the calorie and fat content, as well as the amount of carbohydrate, and vitamins and minerals. Food labels are a great reference for you to use to learn about the food you are eating. BREAKING DOWN THE FOOD LABEL Serving Size: The serving size is an amount of food and is often listed in cups, weight, or units. All of the nutrition information about the food is listed according to the serving size. If you double the serving size, you must double the amounts on the label.  Servings per [employer] or Package: The number of servings in the container is listed here.  Calories: The number of calories in one serving is listed here. Everyone needs a different amount of calories each day. Having calories listed on the label is helpful information for people who would like to keep track of the number of calories they eat to stay at a healthy weight. Calories from Fat:  The number of calories that come from fat in one serving are listed here.  NUTRIENTS THAT ARE LISTED ON THE FOOD LABEL.   Percent Daily Value: The food label helps you know if you are getting the amounts of nutrients you need each day by the percent daily value. It tells you how much of your daily values of each nutrient are provided by one serving of the food. The percent daily value is based on a 2000 calorie diet. You may need more or less than 2000  calories each day.  Total Fat: The total amount of fat in one serving is listed here. The number is shown in grams (g). This information is important for people who want to keep track of the amount of fat in their diet. Foods with high amounts of fat usually have higher calories and may lead to weight gain.  Saturated Fat:  The amount of saturated fat in one serving is listed here. It is also shown in grams. Saturated fat is one type of fat that is found in food. It increases the amount of blood cholesterol more than other types of fat found in food. So saturated fat should be limited in the diet to less than 7 percent of total calories each day for most people. This means that if a person eats 2000 calories each day, they  should eat less than 140 calories from saturated fat.  Trans Fat: The amount of trans fat in one serving is listed here. It is also shown in grams. Trans fat is another type of fat that is found in food. It should also be limited to less than 2 grams per day because it increases blood cholesterol.  Cholesterol: The amount of cholesterol in one serving is listed here. It is shown in milligrams (mg). Cholesterol should be limited to no more than 200 mg each day.  Sodium: The amount of sodium in one serving is listed here. It is shown in milligrams. American Heart Association recommends that sodium should be limited to 1500mg /day. This recommended level of sodium was recently lowered from 2400mg /day.  Total Carbohydrate: The amount of carbohydrate in one serving is listed here. It is shown in grams. This information is important for people with diabetes because they need to manage the amount of carbohydrate they eat. Carbohydrate changes the amount of glucose or sugar in the blood and diabetics do not want that amount to be too high or too low.  Dietary Fiber:  The amount of dietary fiber in one serving is listed here. It is shown in grams. Fiber is a type of carbohydrate. Most people  should eat 25 grams of dietary fiber each day.  Sugars: The amount of sugar in one serving is listed here. It is shown in grams. Sugars are also a type of carbohydrate. This value includes both naturally occurring sugars from fruit and milk and added sugars such as honey or table sugar.  Protein: The amount of protein in one serving is listed here. It is shown in grams.  Vitamins and Minerals: Food labels list vitamin A, vitamin C, calcium and iron. They are all shown as a percent of the daily need one serving of the food provides. For example, if 15% is listed next to iron it means that one serving of that food will give you 15% of the total amount of iron you need for one day.  Calories per Gram: Some food labels will list the number of calories that are in each gram or protein, carbohydrate and fat. Protein has four calories per gram, carbohydrate has four calories per gram, and fat has 9 calories per gram.  Ingredients: Food labels will list each ingredient in the food. The first ingredient listed is the ingredient that the food has the most of. The ingredients are listed in the order of their amount from highest to lowest.  Contains: Food labels may also include this portion of the label as a food allergen warning. Listed here are ingredients that can cause allergies in some people. Examples of ingredients that are listed are wheat, dairy, eggs, soy and nuts. If a person knows that are allergic to one of these ingredients they will know not to eat the food in the container. Information from www.eatright.[employer] Nutritional Analysis Database, ADA Nutrition Care Manual. Document Released: 10/01/2005 Document Revised: 12/24/2011 Document Reviewed: 02/14/2009 [facility] Patient Information 2014 [place], [place].   Please make an appointment to have your blood pressure checked next week after her wedding. Please attempt to follow a low sodium diet. Attempt to engage in at least 150 minutes of  exercise weekly Congratulations on your wedding.

## 2014-02-25 NOTE — Assessment & Plan Note (Signed)
Plantar wart has dissolved few weeks after last prior treatment.

## 2014-02-25 NOTE — Assessment & Plan Note (Signed)
Patient currently on 25 mg twice a day and HCTZ. Would like to see her blood pressure be below 130/80. The pressure today is 130/92. Patient may be having a little bit of changes in her blood pressure do to anxiety of upcoming wedding on Saturday. Patient to schedule nurse visit to have blood pressure checked next week after wedding. Low-salt diet and label reading was given today and recommended for patient to follow more closely. Recommended 150 minutes of physical activity a week. If patient's blood pressure is again elevated out of desired range after wedding we'll consider adding calcium channel blocker.

## 2014-03-04 ENCOUNTER — Other Ambulatory Visit: Payer: Self-pay | Admitting: Family Medicine

## 2014-03-11 ENCOUNTER — Ambulatory Visit (INDEPENDENT_AMBULATORY_CARE_PROVIDER_SITE_OTHER): Payer: BC Managed Care – PPO | Admitting: *Deleted

## 2014-03-11 VITALS — BP 138/80 | HR 90

## 2014-03-11 DIAGNOSIS — Z136 Encounter for screening for cardiovascular disorders: Secondary | ICD-10-CM

## 2014-03-11 DIAGNOSIS — Z0130 Encounter for examination of blood pressure without abnormal findings: Secondary | ICD-10-CM

## 2014-03-11 NOTE — Progress Notes (Signed)
   Pt in nurse clinic for blood pressure check.  Blood Pressure 138/80 manually; pt just got off work.   Pt denies any symptoms at this time.  Pt stated she took morning dose of medication and second dose due this afternoon once she gets home.  Will forward to PCP.  Derl Barrow, RN

## 2014-03-15 ENCOUNTER — Telehealth: Payer: Self-pay | Admitting: Family Medicine

## 2014-03-15 NOTE — Telephone Encounter (Signed)
Please call Rhonda Henderson and let her know I have reviewed her nurse visit, and I feel we will leave her medication were it is at for now. I would like her to continue to watch her salt intake, diet and exercise. Please have her make a follow up appointment in 4 weeks. Thanks.

## 2014-03-15 NOTE — Telephone Encounter (Signed)
Patient informed. 

## 2014-03-15 NOTE — Telephone Encounter (Signed)
Left message on patient's voicemail.[name] [name]  

## 2014-05-12 ENCOUNTER — Other Ambulatory Visit: Payer: Self-pay | Admitting: Family Medicine

## 2014-09-23 ENCOUNTER — Other Ambulatory Visit: Payer: Self-pay | Admitting: Family Medicine

## 2014-10-04 ENCOUNTER — Other Ambulatory Visit: Payer: Self-pay

## 2014-10-04 DIAGNOSIS — Z1231 Encounter for screening mammogram for malignant neoplasm of breast: Secondary | ICD-10-CM

## 2014-10-22 ENCOUNTER — Other Ambulatory Visit: Payer: Self-pay | Admitting: Family Medicine

## 2014-11-08 ENCOUNTER — Other Ambulatory Visit: Payer: Self-pay

## 2014-11-08 ENCOUNTER — Ambulatory Visit
Admission: RE | Admit: 2014-11-08 | Discharge: 2014-11-08 | Disposition: A | Discharge status: Home / Self Care | Payer: BC Managed Care – PPO | Source: Ambulatory Visit

## 2014-11-08 DIAGNOSIS — Z1231 Encounter for screening mammogram for malignant neoplasm of breast: Secondary | ICD-10-CM

## 2014-11-15 ENCOUNTER — Encounter: Payer: Self-pay | Admitting: Family Medicine

## 2015-04-04 ENCOUNTER — Encounter: Payer: BC Managed Care – PPO | Admitting: Family Medicine

## 2015-10-18 ENCOUNTER — Other Ambulatory Visit: Payer: Self-pay

## 2015-10-18 DIAGNOSIS — Z1231 Encounter for screening mammogram for malignant neoplasm of breast: Secondary | ICD-10-CM

## 2015-10-19 ENCOUNTER — Other Ambulatory Visit: Payer: Self-pay | Admitting: Internal Medicine

## 2015-10-19 DIAGNOSIS — M5416 Radiculopathy, lumbar region: Secondary | ICD-10-CM

## 2015-10-26 ENCOUNTER — Other Ambulatory Visit: Payer: BC Managed Care – PPO

## 2015-10-27 ENCOUNTER — Ambulatory Visit
Admission: RE | Admit: 2015-10-27 | Discharge: 2015-10-27 | Disposition: A | Discharge status: Home / Self Care | Payer: BC Managed Care – PPO | Source: Ambulatory Visit | Attending: Internal Medicine | Admitting: Internal Medicine

## 2015-10-27 DIAGNOSIS — M5416 Radiculopathy, lumbar region: Secondary | ICD-10-CM

## 2015-10-31 ENCOUNTER — Other Ambulatory Visit: Payer: Self-pay | Admitting: Internal Medicine

## 2015-10-31 DIAGNOSIS — M5136 Other intervertebral disc degeneration, lumbar region: Secondary | ICD-10-CM

## 2015-11-09 ENCOUNTER — Other Ambulatory Visit: Payer: BC Managed Care – PPO

## 2015-11-10 ENCOUNTER — Ambulatory Visit
Admission: RE | Admit: 2015-11-10 | Discharge: 2015-11-10 | Disposition: A | Discharge status: Home / Self Care | Payer: BC Managed Care – PPO | Source: Ambulatory Visit

## 2015-11-10 DIAGNOSIS — Z1231 Encounter for screening mammogram for malignant neoplasm of breast: Secondary | ICD-10-CM

## 2015-11-11 ENCOUNTER — Ambulatory Visit
Admission: RE | Admit: 2015-11-11 | Discharge: 2015-11-11 | Disposition: A | Discharge status: Home / Self Care | Payer: BC Managed Care – PPO | Source: Ambulatory Visit | Attending: Internal Medicine | Admitting: Internal Medicine

## 2015-11-11 DIAGNOSIS — M5136 Other intervertebral disc degeneration, lumbar region: Secondary | ICD-10-CM

## 2015-11-11 MED ORDER — METHYLPREDNISOLONE ACETATE 40 MG/ML INJ SUSP (RADIOLOG
120.0000 mg | Freq: Once | INTRAMUSCULAR | Status: AC
  Administered 2015-11-11: 120 mg via EPIDURAL

## 2015-11-11 MED ORDER — IOHEXOL 180 MG/ML  SOLN
1.0000 mL | Freq: Once | INTRAMUSCULAR | Status: AC | PRN
  Administered 2015-11-11: 1 mL via EPIDURAL

## 2015-11-11 NOTE — Discharge Instructions (Signed)

## 2016-10-10 ENCOUNTER — Other Ambulatory Visit: Payer: Self-pay | Admitting: Internal Medicine

## 2016-10-10 DIAGNOSIS — Z1231 Encounter for screening mammogram for malignant neoplasm of breast: Secondary | ICD-10-CM

## 2016-11-12 ENCOUNTER — Ambulatory Visit: Payer: BC Managed Care – PPO

## 2016-11-14 ENCOUNTER — Other Ambulatory Visit: Payer: Self-pay | Admitting: Internal Medicine

## 2016-11-14 DIAGNOSIS — M5136 Other intervertebral disc degeneration, lumbar region: Secondary | ICD-10-CM

## 2016-12-05 ENCOUNTER — Ambulatory Visit
Admission: RE | Admit: 2016-12-05 | Discharge: 2016-12-05 | Disposition: A | Discharge status: Home / Self Care | Payer: BC Managed Care – PPO | Source: Ambulatory Visit | Attending: Internal Medicine | Admitting: Internal Medicine

## 2016-12-05 DIAGNOSIS — Z1231 Encounter for screening mammogram for malignant neoplasm of breast: Secondary | ICD-10-CM

## 2016-12-07 ENCOUNTER — Ambulatory Visit
Admission: RE | Admit: 2016-12-07 | Discharge: 2016-12-07 | Disposition: A | Discharge status: Home / Self Care | Payer: BC Managed Care – PPO | Source: Ambulatory Visit | Attending: Internal Medicine | Admitting: Internal Medicine

## 2016-12-07 DIAGNOSIS — M5136 Other intervertebral disc degeneration, lumbar region: Secondary | ICD-10-CM

## 2016-12-07 MED ORDER — IOPAMIDOL (ISOVUE-M 200) INJECTION 41%
1.0000 mL | Freq: Once | INTRAMUSCULAR | Status: AC
  Administered 2016-12-07: 1 mL via EPIDURAL

## 2016-12-07 MED ORDER — METHYLPREDNISOLONE ACETATE 40 MG/ML INJ SUSP (RADIOLOG
120.0000 mg | Freq: Once | INTRAMUSCULAR | Status: AC
  Administered 2016-12-07: 120 mg via EPIDURAL

## 2016-12-07 NOTE — Discharge Instructions (Signed)

## 2017-11-05 ENCOUNTER — Other Ambulatory Visit: Payer: Self-pay | Admitting: Internal Medicine

## 2017-11-05 DIAGNOSIS — Z1231 Encounter for screening mammogram for malignant neoplasm of breast: Secondary | ICD-10-CM

## 2017-12-06 ENCOUNTER — Other Ambulatory Visit: Payer: Self-pay | Admitting: Internal Medicine

## 2017-12-06 ENCOUNTER — Ambulatory Visit
Admission: RE | Admit: 2017-12-06 | Discharge: 2017-12-06 | Disposition: A | Discharge status: Home / Self Care | Payer: BC Managed Care – PPO | Source: Ambulatory Visit | Attending: Internal Medicine | Admitting: Internal Medicine

## 2017-12-06 DIAGNOSIS — Z1231 Encounter for screening mammogram for malignant neoplasm of breast: Secondary | ICD-10-CM

## 2018-06-30 ENCOUNTER — Ambulatory Visit (INDEPENDENT_AMBULATORY_CARE_PROVIDER_SITE_OTHER): Payer: Self-pay

## 2018-06-30 ENCOUNTER — Encounter (INDEPENDENT_AMBULATORY_CARE_PROVIDER_SITE_OTHER): Payer: Self-pay | Admitting: Orthopedic Surgery

## 2018-06-30 ENCOUNTER — Ambulatory Visit (INDEPENDENT_AMBULATORY_CARE_PROVIDER_SITE_OTHER): Payer: BC Managed Care – PPO | Admitting: Orthopedic Surgery

## 2018-06-30 DIAGNOSIS — G8929 Other chronic pain: Secondary | ICD-10-CM

## 2018-06-30 DIAGNOSIS — M25562 Pain in left knee: Secondary | ICD-10-CM

## 2018-06-30 DIAGNOSIS — M545 Low back pain: Secondary | ICD-10-CM

## 2018-06-30 DIAGNOSIS — M25561 Pain in right knee: Secondary | ICD-10-CM | POA: Diagnosis not present

## 2018-07-01 ENCOUNTER — Encounter (INDEPENDENT_AMBULATORY_CARE_PROVIDER_SITE_OTHER): Payer: Self-pay | Admitting: Orthopedic Surgery

## 2018-07-01 NOTE — Progress Notes (Signed)
Office Visit Note   Patient: Rhonda Henderson           Date of Birth: 1975/12/22           MRN: [id number] Visit Date: 06/30/2018 Requested by: Merrilee Seashore, [address], [place] [address] PCP: Merrilee Seashore, MD  Subjective: Chief Complaint  Patient presents with  . Right Knee - Pain  . Left Knee - Pain  . Lower Back - Pain    HPI: Patient presents with bilateral knee pain left worse than right along with back pain.  She has a long history of back problems.  States that she has back arthritis and degenerative disc disease.  She has had injections in the past about 1/year but none the last year.  MRI scan of the lumbar spine in 2017 showed degenerative disc disease and arthritis.  She states that the spine injections in the past have not helped her much.  She states that she has had right knee and right hip injections which have helped but left knee injection done 2 weeks ago did not help at all.  She works as a Pharmacist, hospital.  She is been taking muscle relaxer and Tylenol for the problem.              ROS: All systems reviewed are negative as they relate to the chief complaint within the history of present illness.  Patient denies  fevers or chills.   Assessment & Plan: Visit Diagnoses:  1. Chronic pain of both knees   2. Chronic low back pain, unspecified back pain laterality, with sciatica presence unspecified     Plan: Impression is bilateral knee pain with left greater than right arthritis.  Left knee currently symptomatic.  Injection performed today.  In regards to her back I think she would do well to consult with Dr. Ernestina Patches for further management.  I do not know if she is interested in surgery but I would try more injections first.  Follow-Up Instructions: No follow-ups on file.   Orders:  Orders Placed This Encounter  Procedures  . XR KNEE 3 VIEW LEFT  . XR KNEE 3 VIEW RIGHT  . XR Lumbar Spine 2-3 Views  . Ambulatory referral to Physical  Medicine Rehab   No orders of the defined types were placed in this encounter.     Procedures: No procedures performed   Clinical Data: No additional findings.  Objective: Vital Signs: There were no vitals taken for this visit.  Physical Exam:   Constitutional: Patient appears well-developed HEENT:  Head: Normocephalic Eyes:EOM are normal Neck: Normal range of motion Cardiovascular: Normal rate Pulmonary/chest: Effort normal Neurologic: Patient is alert Skin: Skin is warm Psychiatric: Patient has normal mood and affect    Ortho Exam: Ortho exam demonstrates full active and passive range of motion of the hips.  Ankles nontender.  Pedal pulses palpable.  Patient has no effusion in either knee.  Soft tissue envelope large.  Extensor mechanism intact.  Collateral and cruciate ligament stable.  No other masses lymphadenopathy or skin changes noted in that knee region.  She has no nerve root tension signs symmetric reflexes and no paresthesias L1 S1 bilaterally.  Some pain with forward and lateral bending but no discrete trochanteric tenderness is present.  Specialty Comments:  No specialty comments available.  Imaging: Xr Knee 3 View Left  Result Date: 07/01/2018 AP lateral merchant left knee reviewed.  Patient has medial greater than lateral compartment arthritis as well as  patellofemoral arthritis and spurring.  No effusion is present.  Alignment is slight varus.  No fracture or dislocation seen.  Xr Knee 3 View Right  Result Date: 07/01/2018 AP lateral merchant right knee reviewed.  Patient has medial greater than lateral compartment arthritis.  Patellofemoral arthritis also present with some spurring.  No effusion is present.  Mild varus alignment present.  No fracture seen.  Xr Lumbar Spine 2-3 Views  Result Date: 07/01/2018 AP lateral lumbar spine reviewed.  Degenerative disc disease is present at L4-5 with corresponding mild to moderate facet joint arthritis.  No  spondylolisthesis or compression fractures.  Visualized hips appear to be non-arthritic.    PMFS History: Patient Active Problem List   Diagnosis Date Noted  . Well woman exam 12/14/2013  . Fibroids 12/11/2013  . Obesity, morbid ([place]) 12/11/2013  . Plantar wart of left foot 10/06/2013  . Hypertension 01/15/2013  . Back pain 01/04/2013  . GERD (gastroesophageal reflux disease) 04/30/2012  . Breast nodule 04/30/2012  . Contraceptive management 01/23/2011  . Seasonal allergies 01/23/2011  . CONTACT DERMATITIS&OTHER ECZEMA DUE UNSPEC CAUSE 10/13/2010  . 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 OBESITY, NOS 12/12/2006   History reviewed. No pertinent past medical history.  Family History  Problem Relation Age of Onset  . Hypertension Mother   . Diabetes Mother   . Hypertension Father   . Diabetes Father   . Diabetes Sister   . Cancer Maternal Aunt        breast  . Cancer Maternal Uncle        lung  . Cancer Maternal Grandmother        stomach  . Cancer Paternal Grandmother        thyroid  . Cancer Paternal Grandfather        prostate    Past Surgical History:  Procedure Laterality Date  . CESAREAN SECTION     Social History   Occupational History  . Not on file  Tobacco Use  . Smoking status: Never Smoker  Substance and Sexual Activity  . Alcohol use: Not on file  . Drug use: Not on file  . Sexual activity: Not on file

## 2018-07-22 ENCOUNTER — Ambulatory Visit (INDEPENDENT_AMBULATORY_CARE_PROVIDER_SITE_OTHER): Payer: BC Managed Care – PPO | Admitting: Physical Medicine and Rehabilitation

## 2018-07-22 ENCOUNTER — Encounter (INDEPENDENT_AMBULATORY_CARE_PROVIDER_SITE_OTHER): Payer: Self-pay | Admitting: Physical Medicine and Rehabilitation

## 2018-07-22 VITALS — BP 135/85 | HR 86 | Temp 98.4°F | Ht 62.0 in | Wt 236.0 lb

## 2018-07-22 DIAGNOSIS — M545 Low back pain: Secondary | ICD-10-CM

## 2018-07-22 DIAGNOSIS — M5416 Radiculopathy, lumbar region: Secondary | ICD-10-CM

## 2018-07-22 DIAGNOSIS — R202 Paresthesia of skin: Secondary | ICD-10-CM

## 2018-07-22 DIAGNOSIS — G8929 Other chronic pain: Secondary | ICD-10-CM

## 2018-07-22 DIAGNOSIS — M47816 Spondylosis without myelopathy or radiculopathy, lumbar region: Secondary | ICD-10-CM

## 2018-07-22 NOTE — Progress Notes (Signed)
.  Numeric Pain Rating Scale and Functional Assessment Average Pain 8 Pain Right Now 8 My pain is constant, sharp and stabbing Pain is worse with: standing Pain improves with: rest   In the last MONTH (on 0-10 scale) has pain interfered with the following?  1. General activity like being  able to carry out your everyday physical activities such as walking, climbing stairs, carrying groceries, or moving a chair?  Rating(7)  2. Relation with others like being able to carry out your usual social activities and roles such as  activities at home, at work and in your community. Rating(4)  3. Enjoyment of life such that you have  been bothered by emotional problems such as feeling anxious, depressed or irritable?  Rating(3)

## 2018-07-23 ENCOUNTER — Telehealth (INDEPENDENT_AMBULATORY_CARE_PROVIDER_SITE_OTHER): Payer: Self-pay | Admitting: *Deleted

## 2018-07-23 NOTE — Telephone Encounter (Signed)
Spoke to [place] from [facility] and she states no prior Josem Kaufmann is needed on an outpatient procedure [phone]. Reference [phone]

## 2018-08-22 ENCOUNTER — Ambulatory Visit (INDEPENDENT_AMBULATORY_CARE_PROVIDER_SITE_OTHER): Payer: Self-pay

## 2018-08-22 ENCOUNTER — Ambulatory Visit (INDEPENDENT_AMBULATORY_CARE_PROVIDER_SITE_OTHER): Payer: BC Managed Care – PPO | Admitting: Physical Medicine and Rehabilitation

## 2018-08-22 ENCOUNTER — Encounter (INDEPENDENT_AMBULATORY_CARE_PROVIDER_SITE_OTHER): Payer: Self-pay | Admitting: Physical Medicine and Rehabilitation

## 2018-08-22 VITALS — BP 139/84 | HR 92 | Temp 98.3°F

## 2018-08-22 DIAGNOSIS — M47816 Spondylosis without myelopathy or radiculopathy, lumbar region: Secondary | ICD-10-CM

## 2018-08-22 MED ORDER — BUPIVACAINE HCL 0.5 % IJ SOLN
3.0000 mL | Freq: Once | INTRAMUSCULAR | Status: AC
  Administered 2018-08-22: 3 mL

## 2018-08-22 NOTE — Progress Notes (Signed)
 .  Numeric Pain Rating Scale and Functional Assessment Average Pain 8   In the last MONTH (on 0-10 scale) has pain interfered with the following?  1. General activity like being  able to carry out your everyday physical activities such as walking, climbing stairs, carrying groceries, or moving a chair?  Rating(6)   +Driver, -BT, -Dye Allergies.  

## 2018-08-22 NOTE — Patient Instructions (Signed)

## 2018-08-27 ENCOUNTER — Telehealth (INDEPENDENT_AMBULATORY_CARE_PROVIDER_SITE_OTHER): Payer: Self-pay | Admitting: Physical Medicine and Rehabilitation

## 2018-08-27 NOTE — Telephone Encounter (Signed)
Could try epidural vs update MRI vs different PT, injection was not diagnostic

## 2018-08-28 NOTE — Telephone Encounter (Signed)
Patient wants to try ESI. Need codes for auth. She also stated that she has had some bumps and itching at the site, and the itching is continuing. She is not sure if this is from the contrast, although she reported no allergy, or the band-aid. Please advise.

## 2018-08-28 NOTE — Telephone Encounter (Signed)
Is auth needed for [phone]. Patient is already scheduled.

## 2018-08-28 NOTE — Telephone Encounter (Signed)
There  Would be no contrast "at the site" so may have been from band aid or ethyl chloride. Just interlam esi code

## 2018-08-29 NOTE — Telephone Encounter (Signed)
Rhonda Henderson from [facility] states no PA is needed for [phone]. Reference#[id number]

## 2018-09-05 NOTE — Progress Notes (Signed)
Rhonda Henderson - 42 y.o. female MRN [id number]  Date of birth: Sep 24, 1976  Office Visit Note: Visit Date: 08/22/2018 PCP: Merrilee Seashore, MD Referred by: Merrilee Seashore, MD  Subjective: Chief Complaint  Patient presents with  . Lower Back - Pain  . Right Leg - Pain, Numbness, Tingling  . Right Foot - Pain, Numbness, Tingling   HPI:  Rhonda Henderson is a 42 y.o. female who comes in today For planned diagnostic medial branch blocks and facet joint blocks L4-5 for chronic worsening axial low back pain.  Please see our prior evaluation and management note for further details and justification.  ROS Otherwise per HPI.  Assessment & Plan: Visit Diagnoses:  1. Spondylosis without myelopathy or radiculopathy, lumbar region     Plan: No additional findings.   Meds & Orders:  Meds ordered this encounter  Medications  . bupivacaine (MARCAINE) 0.5 % (with pres) injection 3 mL    Orders Placed This Encounter  Procedures  . Facet Injection  . XR C-ARM NO REPORT    Follow-up: Return for Review Pain Diary.   Procedures: No procedures performed  Lumbar Diagnostic Facet Joint Nerve Block with Fluoroscopic Guidance   Patient: Rhonda Henderson      Date of Birth: 1976-09-11 MRN: [id number] PCP: Merrilee Seashore, MD      Visit Date: 08/22/2018   Universal Protocol:    Date/Time: 11/22/196:03 AM  Consent Given By: the patient  Position: PRONE  Additional Comments: Vital signs were monitored before and after the procedure. Patient was prepped and draped in the usual sterile fashion. The correct patient, procedure, and site was verified.   Injection Procedure Details:  Procedure Site One Meds Administered:  Meds ordered this encounter  Medications  . bupivacaine (MARCAINE) 0.5 % (with pres) injection 3 mL     Laterality: Bilateral  Location/Site:  L4-L5  Needle size: 22 ga.  Needle type:spinal  Needle Placement: Oblique pedical  Findings:   -Comments:  There was excellent flow of contrast along the articular pillars without intravascular flow.  Procedure Details: The fluoroscope beam is vertically oriented in AP and then obliqued 15 to 20 degrees to the ipsilateral side of the desired nerve to achieve the "Scotty dog" appearance.  The skin over the target area of the junction of the superior articulating process and the transverse process (sacral ala if blocking the L5 dorsal rami) was locally anesthetized with a 1 ml volume of 1% Lidocaine without Epinephrine.  The spinal needle was inserted and advanced in a trajectory view down to the target.   After contact with periosteum and negative aspirate for blood and CSF, correct placement without intravascular or epidural spread was confirmed by injecting 0.5 ml. of Isovue-250.  A spot radiograph was obtained of this image.    Next, a 0.5 ml. volume of the injectate described above was injected. The needle was then redirected to the other facet joint nerves mentioned above if needed.  Prior to the procedure, the patient was given a Pain Diary which was completed for baseline measurements.  After the procedure, the patient rated their pain every 30 minutes and will continue rating at this frequency for a total of 5 hours.  The patient has been asked to complete the Diary and return to [place] by mail, fax or hand delivered as soon as possible.   Additional Comments:  The patient tolerated the procedure well Dressing: Band-Aid    Post-procedure details: Patient was observed during the procedure. Post-procedure instructions were reviewed.  Patient left the clinic in stable condition.   Clinical History: MRI LUMBAR SPINE WITHOUT CONTRAST  TECHNIQUE: Multiplanar, multisequence MR imaging of the lumbar spine was performed. No intravenous contrast was administered.  COMPARISON:  None available.  FINDINGS: Normal signal is present in the conus medullaris which terminates at L2, within normal  limits. Edematous endplate marrow changes are present at L4-5, worse on the right. Levoconvex curvature of the lumbar spine is present at L4-5. There is rightward curvature at L2. Vertebral body heights alignment are maintained. Marrow signal is otherwise normal.  Limited imaging of the abdomen is within normal limits. There is no significant adenopathy.  The disc levels at L3-4 and above are normal.  L4-5: A broad-based disc protrusion is present. Mild facet hypertrophy is worse on the right. This results an mild right subarticular and foraminal stenosis. The left foramen is patent.  L5-S1: Mild to moderate facet hypertrophy is worse on the left. There is no significant disc protrusion or stenosis.  IMPRESSION: 1. Rightward disc protrusion and mild right subarticular and foraminal stenosis at L4-5. 2. Bilateral facet hypertrophy at L4-5 and L5-S1.   Electronically Signed   By: San Morelle M.D.   On: 10/28/2015 07:20     Objective:  VS:  HT:    WT:   BMI:     BP:139/84  HR:92bpm  TEMP:98.3 F (36.8 C)(Oral)  RESP:  Physical Exam  Ortho Exam Imaging: No results found.

## 2018-09-05 NOTE — Procedures (Signed)
Lumbar Diagnostic Facet Joint Nerve Block with Fluoroscopic Guidance   Patient: Rhonda Henderson      Date of Birth: 1976/06/27 MRN: [id number] PCP: Merrilee Seashore, MD      Visit Date: 08/22/2018   Universal Protocol:    Date/Time: 11/22/196:03 AM  Consent Given By: the patient  Position: PRONE  Additional Comments: Vital signs were monitored before and after the procedure. Patient was prepped and draped in the usual sterile fashion. The correct patient, procedure, and site was verified.   Injection Procedure Details:  Procedure Site One Meds Administered:  Meds ordered this encounter  Medications  . bupivacaine (MARCAINE) 0.5 % (with pres) injection 3 mL     Laterality: Bilateral  Location/Site:  L4-L5  Needle size: 22 ga.  Needle type:spinal  Needle Placement: Oblique pedical  Findings:   -Comments: There was excellent flow of contrast along the articular pillars without intravascular flow.  Procedure Details: The fluoroscope beam is vertically oriented in AP and then obliqued 15 to 20 degrees to the ipsilateral side of the desired nerve to achieve the "Scotty dog" appearance.  The skin over the target area of the junction of the superior articulating process and the transverse process (sacral ala if blocking the L5 dorsal rami) was locally anesthetized with a 1 ml volume of 1% Lidocaine without Epinephrine.  The spinal needle was inserted and advanced in a trajectory view down to the target.   After contact with periosteum and negative aspirate for blood and CSF, correct placement without intravascular or epidural spread was confirmed by injecting 0.5 ml. of Isovue-250.  A spot radiograph was obtained of this image.    Next, a 0.5 ml. volume of the injectate described above was injected. The needle was then redirected to the other facet joint nerves mentioned above if needed.  Prior to the procedure, the patient was given a Pain Diary which was completed for  baseline measurements.  After the procedure, the patient rated their pain every 30 minutes and will continue rating at this frequency for a total of 5 hours.  The patient has been asked to complete the Diary and return to [place] by mail, fax or hand delivered as soon as possible.   Additional Comments:  The patient tolerated the procedure well Dressing: Band-Aid    Post-procedure details: Patient was observed during the procedure. Post-procedure instructions were reviewed.  Patient left the clinic in stable condition.

## 2018-09-09 ENCOUNTER — Encounter (INDEPENDENT_AMBULATORY_CARE_PROVIDER_SITE_OTHER): Payer: Self-pay | Admitting: Physical Medicine and Rehabilitation

## 2018-09-09 ENCOUNTER — Ambulatory Visit (INDEPENDENT_AMBULATORY_CARE_PROVIDER_SITE_OTHER): Payer: BC Managed Care – PPO | Admitting: Physical Medicine and Rehabilitation

## 2018-09-09 ENCOUNTER — Ambulatory Visit (INDEPENDENT_AMBULATORY_CARE_PROVIDER_SITE_OTHER): Payer: Self-pay

## 2018-09-09 VITALS — BP 137/81 | HR 92 | Temp 98.8°F

## 2018-09-09 DIAGNOSIS — M5416 Radiculopathy, lumbar region: Secondary | ICD-10-CM

## 2018-09-09 DIAGNOSIS — M5116 Intervertebral disc disorders with radiculopathy, lumbar region: Secondary | ICD-10-CM

## 2018-09-09 MED ORDER — METHYLPREDNISOLONE ACETATE 80 MG/ML IJ SUSP
80.0000 mg | Freq: Once | INTRAMUSCULAR | Status: AC
  Administered 2018-09-09: 80 mg

## 2018-09-09 NOTE — Patient Instructions (Signed)

## 2018-09-09 NOTE — Progress Notes (Signed)
 .  Numeric Pain Rating Scale and Functional Assessment Average Pain 8   In the last MONTH (on 0-10 scale) has pain interfered with the following?  1. General activity like being  able to carry out your everyday physical activities such as walking, climbing stairs, carrying groceries, or moving a chair?  Rating(7)   +Driver, -BT, -Dye Allergies.  

## 2018-10-01 NOTE — Progress Notes (Signed)
Rhonda Henderson - 42 y.o. female MRN [id number]  Date of birth: [birth year]  Office Visit Note: Visit Date: 09/09/2018 PCP: Merrilee Seashore, MD Referred by: Merrilee Seashore, MD  Subjective: Chief Complaint  Patient presents with  . Lower Back - Pain  . Right Leg - Pain  . Left Leg - Pain   HPI:  Rhonda Henderson is a 42 y.o. female who comes in today For planned lumbar epidural injection status post prior facet joint block which was only minimally helpful.  She has really more findings of facet arthropathy quite severe at L4-5 but there is small disc protrusion lateral recess narrowing.  She does get some radicular complaints but also some nondermatomal pattern as well.  We are going to try diagnostic and hopefully therapeutic epidural injection at L4-5 on the right.  Please see our prior evaluation and management note for further details and justification.  ROS Otherwise per HPI.  Assessment & Plan: Visit Diagnoses:  1. Lumbar radiculopathy   2. Radiculopathy due to lumbar intervertebral disc disorder     Plan: No additional findings.   Meds & Orders:  Meds ordered this encounter  Medications  . methylPREDNISolone acetate (DEPO-MEDROL) injection 80 mg    Orders Placed This Encounter  Procedures  . XR C-ARM NO REPORT  . Epidural Steroid injection    Follow-up: Return if symptoms worsen or fail to improve.   Procedures: No procedures performed  Lumbar Epidural Steroid Injection - Interlaminar Approach with Fluoroscopic Guidance  Patient: Rhonda Henderson      Date of Birth: [birth year] MRN: [id number] PCP: Merrilee Seashore, MD      Visit Date: 09/09/2018   Universal Protocol:     Consent Given By: the patient  Position: PRONE  Additional Comments: Vital signs were monitored before and after the procedure. Patient was prepped and draped in the usual sterile fashion. The correct patient, procedure, and site was verified.   Injection Procedure Details:    Procedure Site One Meds Administered:  Meds ordered this encounter  Medications  . methylPREDNISolone acetate (DEPO-MEDROL) injection 80 mg     Laterality: Right  Location/Site:  L4-L5  Needle size: 20 G  Needle type: Tuohy  Needle Placement: Paramedian epidural  Findings:   -Comments: Excellent flow of contrast into the epidural space.  Procedure Details: Using a paramedian approach from the side mentioned above, the region overlying the inferior lamina was localized under fluoroscopic visualization and the soft tissues overlying this structure were infiltrated with 4 ml. of 1% Lidocaine without Epinephrine. The Tuohy needle was inserted into the epidural space using a paramedian approach.   The epidural space was localized using loss of resistance along with lateral and bi-planar fluoroscopic views.  After negative aspirate for air, blood, and CSF, a 2 ml. volume of Isovue-250 was injected into the epidural space and the flow of contrast was observed. Radiographs were obtained for documentation purposes.    The injectate was administered into the level noted above.   Additional Comments:  The patient tolerated the procedure well Dressing: Band-Aid    Post-procedure details: Patient was observed during the procedure. Post-procedure instructions were reviewed.  Patient left the clinic in stable condition.    Clinical History: MRI LUMBAR SPINE WITHOUT CONTRAST  TECHNIQUE: Multiplanar, multisequence MR imaging of the lumbar spine was performed. No intravenous contrast was administered.  COMPARISON:  None available.  FINDINGS: Normal signal is present in the conus medullaris which terminates at L2, within normal limits. Edematous endplate marrow changes  are present at L4-5, worse on the right. Levoconvex curvature of the lumbar spine is present at L4-5. There is rightward curvature at L2. Vertebral body heights alignment are maintained. Marrow signal  is otherwise normal.  Limited imaging of the abdomen is within normal limits. There is no significant adenopathy.  The disc levels at L3-4 and above are normal.  L4-5: A broad-based disc protrusion is present. Mild facet hypertrophy is worse on the right. This results an mild right subarticular and foraminal stenosis. The left foramen is patent.  L5-S1: Mild to moderate facet hypertrophy is worse on the left. There is no significant disc protrusion or stenosis.  IMPRESSION: 1. Rightward disc protrusion and mild right subarticular and foraminal stenosis at L4-5. 2. Bilateral facet hypertrophy at L4-5 and L5-S1.   Electronically Signed   By: San Morelle M.D.   On: 10/28/2015 07:20     Objective:  VS:  HT:    WT:   BMI:     BP:137/81  HR:92bpm  TEMP:98.8 F (37.1 C)(Oral)  RESP:  Physical Exam  Ortho Exam Imaging: No results found.

## 2018-10-01 NOTE — Procedures (Signed)
Lumbar Epidural Steroid Injection - Interlaminar Approach with Fluoroscopic Guidance  Patient: Rhonda Henderson      Date of Birth: 30-Mar-1976 MRN: [id number] PCP: Merrilee Seashore, MD      Visit Date: 09/09/2018   Universal Protocol:     Consent Given By: the patient  Position: PRONE  Additional Comments: Vital signs were monitored before and after the procedure. Patient was prepped and draped in the usual sterile fashion. The correct patient, procedure, and site was verified.   Injection Procedure Details:  Procedure Site One Meds Administered:  Meds ordered this encounter  Medications  . methylPREDNISolone acetate (DEPO-MEDROL) injection 80 mg     Laterality: Right  Location/Site:  L4-L5  Needle size: 20 G  Needle type: Tuohy  Needle Placement: Paramedian epidural  Findings:   -Comments: Excellent flow of contrast into the epidural space.  Procedure Details: Using a paramedian approach from the side mentioned above, the region overlying the inferior lamina was localized under fluoroscopic visualization and the soft tissues overlying this structure were infiltrated with 4 ml. of 1% Lidocaine without Epinephrine. The Tuohy needle was inserted into the epidural space using a paramedian approach.   The epidural space was localized using loss of resistance along with lateral and bi-planar fluoroscopic views.  After negative aspirate for air, blood, and CSF, a 2 ml. volume of Isovue-250 was injected into the epidural space and the flow of contrast was observed. Radiographs were obtained for documentation purposes.    The injectate was administered into the level noted above.   Additional Comments:  The patient tolerated the procedure well Dressing: Band-Aid    Post-procedure details: Patient was observed during the procedure. Post-procedure instructions were reviewed.  Patient left the clinic in stable condition.

## 2018-11-17 ENCOUNTER — Other Ambulatory Visit: Payer: Self-pay | Admitting: Internal Medicine

## 2018-11-17 DIAGNOSIS — Z1231 Encounter for screening mammogram for malignant neoplasm of breast: Secondary | ICD-10-CM

## 2018-12-12 ENCOUNTER — Telehealth (INDEPENDENT_AMBULATORY_CARE_PROVIDER_SITE_OTHER): Payer: Self-pay | Admitting: Physical Medicine and Rehabilitation

## 2018-12-12 ENCOUNTER — Ambulatory Visit
Admission: RE | Admit: 2018-12-12 | Discharge: 2018-12-12 | Disposition: A | Discharge status: Home / Self Care | Payer: BC Managed Care – PPO | Source: Ambulatory Visit | Attending: Internal Medicine | Admitting: Internal Medicine

## 2018-12-12 DIAGNOSIS — Z1231 Encounter for screening mammogram for malignant neoplasm of breast: Secondary | ICD-10-CM

## 2018-12-12 NOTE — Telephone Encounter (Signed)
Scheduled for OV on 12/25/18.

## 2018-12-12 NOTE — Telephone Encounter (Signed)
Needs office visit with me or Marlou Sa and then can try to figure out pain treatment

## 2018-12-25 ENCOUNTER — Other Ambulatory Visit: Payer: Self-pay

## 2018-12-25 ENCOUNTER — Encounter (INDEPENDENT_AMBULATORY_CARE_PROVIDER_SITE_OTHER): Payer: Self-pay | Admitting: Physical Medicine and Rehabilitation

## 2018-12-25 ENCOUNTER — Telehealth (INDEPENDENT_AMBULATORY_CARE_PROVIDER_SITE_OTHER): Payer: Self-pay | Admitting: *Deleted

## 2018-12-25 ENCOUNTER — Ambulatory Visit (INDEPENDENT_AMBULATORY_CARE_PROVIDER_SITE_OTHER): Payer: BC Managed Care – PPO | Admitting: Physical Medicine and Rehabilitation

## 2018-12-25 VITALS — BP 147/87 | HR 82

## 2018-12-25 DIAGNOSIS — M48061 Spinal stenosis, lumbar region without neurogenic claudication: Secondary | ICD-10-CM | POA: Diagnosis not present

## 2018-12-25 DIAGNOSIS — M5441 Lumbago with sciatica, right side: Secondary | ICD-10-CM | POA: Diagnosis not present

## 2018-12-25 DIAGNOSIS — M5416 Radiculopathy, lumbar region: Secondary | ICD-10-CM

## 2018-12-25 DIAGNOSIS — G8929 Other chronic pain: Secondary | ICD-10-CM

## 2018-12-25 DIAGNOSIS — M47816 Spondylosis without myelopathy or radiculopathy, lumbar region: Secondary | ICD-10-CM

## 2018-12-25 NOTE — Progress Notes (Signed)
.  Numeric Pain Rating Scale and Functional Assessment Average Pain 8 Pain Right Now 9 My pain is constant, sharp and stabbing Pain is worse with: walking and standing Pain improves with: rest and medication   In the last MONTH (on 0-10 scale) has pain interfered with the following?  1. General activity like being  able to carry out your everyday physical activities such as walking, climbing stairs, carrying groceries, or moving a chair?  Rating(9)  2. Relation with others like being able to carry out your usual social activities and roles such as  activities at home, at work and in your community. Rating(9)  3. Enjoyment of life such that you have  been bothered by emotional problems such as feeling anxious, depressed or irritable?  Rating(5)

## 2018-12-25 NOTE — Telephone Encounter (Signed)
Spoke with Jenne Pane and he states no pa is needed for [phone] in an outpatient setting. Reference # [id number].

## 2019-01-01 ENCOUNTER — Encounter (INDEPENDENT_AMBULATORY_CARE_PROVIDER_SITE_OTHER): Payer: Self-pay | Admitting: Physical Medicine and Rehabilitation

## 2019-01-01 NOTE — Progress Notes (Signed)
Rhonda Henderson - 43 y.o. female MRN [id number]  Date of birth: 05-02-1976  Office Visit Note: Visit Date: 12/25/2018 PCP: Merrilee Seashore, MD Referred by: Merrilee Seashore, MD  Subjective: Chief Complaint  Patient presents with  . Lower Back - Pain  . Right Leg - Pain  . Right Foot - Numbness   HPI: Rhonda Henderson is a 43 y.o. female who comes in today For reevaluation of chronic worsening severe low back pain and right leg pain.  She reports that the right leg seem to have gotten better after the last injection but ironically that last injection was diagnostic medial branch blocks which did help her back very temporarily but she still feels like it helps her leg.  Arm notes at least back then and that injection was back in November show that prior epidural injection from a transforaminal approach did help her leg pain.  Actually helped for quite a while and we were looking at diagnostic medial branch blocks for her lower back.  Somewhat of a difficult historian overall.  She states that about 3 weeks she has had severe right leg pain and even prior to that it was getting worse over a longer period.  She gets no relief from medications at least not long-term relief.  She reports that standing and walking increase her symptoms quite a bit and she has a difficult time at school.  She is a Pharmacist, hospital.  She has not had any new trauma or bowel or bladder changes or focal weakness.  She does feel weak at times some give way weakness.  Please see our other notes for further details and justification.  Again patient's pain is constant sharp and stabbing at times tickly in the right leg.  She is worse with walking and standing better at rest.  Review of Systems  Constitutional: Negative for chills, fever, malaise/fatigue and weight loss.  HENT: Negative for hearing loss and sinus pain.   Eyes: Negative for blurred vision, double vision and photophobia.  Respiratory: Negative for cough and shortness of  breath.   Cardiovascular: Negative for chest pain, palpitations and leg swelling.  Gastrointestinal: Negative for abdominal pain, nausea and vomiting.  Genitourinary: Negative for flank pain.  Musculoskeletal: Positive for back pain and joint pain. Negative for myalgias.       Right hip and leg pain  Skin: Negative for itching and rash.  Neurological: Negative for tremors, focal weakness and weakness.  Endo/Heme/Allergies: Negative.   Psychiatric/Behavioral: Negative for depression.  All other systems reviewed and are negative.  Otherwise per HPI.  Assessment & Plan: Visit Diagnoses:  1. Lumbar radiculopathy   2. Bilateral stenosis of lateral recess of lumbar spine   3. Spondylosis without myelopathy or radiculopathy, lumbar region   4. Chronic bilateral low back pain with right-sided sciatica   5. Obesity, morbid ([place])     Plan: Findings:  Multifactorial low back and radicular leg pain.  She clearly has significant osteoarthritis of the lumbar spine without focal central stenosis or nerve compression or disc herniation.  She is morbidly obese and that does contribute to some of her low back pain.  The pain in the right leg is radicular and she has been helped in the past with transforaminal epidural steroid injection.  She is somewhat a poor historian overall with what has helped and has not helped.  She gets lost to follow-up at times.  I think the next step is to repeat right L5 transforaminal injection which was done  last year and did help.  If that is better and she still having a lot of back pain we would look at diagnostic medial branch blocks and possibly radiofrequency ablation.  I think even with her body habitus we could still probably do the ablation potentially.  She does not everything is overtly surgical.  She will stay on the same medications.  We did write a note to allow her to have reduced activity at work.  We also will prescribe muscle relaxer such as Valium preprocedure  for the injection.    Meds & Orders: No orders of the defined types were placed in this encounter.  No orders of the defined types were placed in this encounter.   Follow-up: Return for Right L5 transforaminal epidural steroid injection.   Procedures: No procedures performed  No notes on file   Clinical History: MRI LUMBAR SPINE WITHOUT CONTRAST  TECHNIQUE: Multiplanar, multisequence MR imaging of the lumbar spine was performed. No intravenous contrast was administered.  COMPARISON:  None available.  FINDINGS: Normal signal is present in the conus medullaris which terminates at L2, within normal limits. Edematous endplate marrow changes are present at L4-5, worse on the right. Levoconvex curvature of the lumbar spine is present at L4-5. There is rightward curvature at L2. Vertebral body heights alignment are maintained. Marrow signal is otherwise normal.  Limited imaging of the abdomen is within normal limits. There is no significant adenopathy.  The disc levels at L3-4 and above are normal.  L4-5: A broad-based disc protrusion is present. Mild facet hypertrophy is worse on the right. This results an mild right subarticular and foraminal stenosis. The left foramen is patent.  L5-S1: Mild to moderate facet hypertrophy is worse on the left. There is no significant disc protrusion or stenosis.  IMPRESSION: 1. Rightward disc protrusion and mild right subarticular and foraminal stenosis at L4-5. 2. Bilateral facet hypertrophy at L4-5 and L5-S1.   Electronically Signed   By: San Morelle M.D.   On: 10/28/2015 07:20   She reports that she has never smoked. She has never used smokeless tobacco. No results for input(s): HGBA1C, LABURIC in the last 8760 hours.  Objective:  VS:  HT:    WT:   BMI:     BP:(!) 147/87  HR:82bpm  TEMP: ( )  RESP:  Physical Exam Vitals signs and nursing note reviewed.  Constitutional:      General: She is not in acute  distress.    Appearance: Normal appearance. She is well-developed. She is obese. She is not ill-appearing.  HENT:     Head: Normocephalic and atraumatic.  Eyes:     Conjunctiva/sclera: Conjunctivae normal.     Pupils: Pupils are equal, round, and reactive to light.  Cardiovascular:     Rate and Rhythm: Normal rate.     Pulses: Normal pulses.  Pulmonary:     Effort: Pulmonary effort is normal.  Musculoskeletal:     Right lower leg: No edema.     Left lower leg: No edema.     Comments: Patient has pain with going from sit to stand in full extension with facet loading.  She has a negative slump test bilaterally.  She has good distal strength with dorsiflexion plantarflexion EHL and no clonus.  Minimal pain over the greater trochanters.  Skin:    General: Skin is warm and dry.     Findings: No erythema or rash.  Neurological:     General: No focal deficit present.  Mental Status: She is alert and oriented to person, place, and time.     Sensory: No sensory deficit.     Motor: No abnormal muscle tone.     Coordination: Coordination normal.     Gait: Gait normal.  Psychiatric:        Mood and Affect: Mood normal.        Behavior: Behavior normal.     Ortho Exam Imaging: No results found.  Past Medical/Family/Surgical/Social History: Medications & Allergies reviewed per EMR, new medications updated. Patient Active Problem List   Diagnosis Date Noted  . Well woman exam 12/14/2013  . Fibroids 12/11/2013  . Obesity, morbid ([place]) 12/11/2013  . Plantar wart of left foot 10/06/2013  . Hypertension 01/15/2013  . Back pain 01/04/2013  . GERD (gastroesophageal reflux disease) 04/30/2012  . Breast nodule 04/30/2012  . Contraceptive management 01/23/2011  . Seasonal allergies 01/23/2011  . CONTACT DERMATITIS&OTHER ECZEMA DUE UNSPEC CAUSE 10/13/2010  . 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 OBESITY, NOS 12/12/2006   History reviewed. No pertinent past medical history. Family History   Problem Relation Age of Onset  . Hypertension Mother   . Diabetes Mother   . Hypertension Father   . Diabetes Father   . Diabetes Sister   . Cancer Maternal Aunt        breast  . Breast cancer Maternal Aunt   . Cancer Maternal Uncle        lung  . Cancer Maternal Grandmother        stomach  . Cancer Paternal Grandmother        thyroid  . Cancer Paternal Grandfather        prostate   Past Surgical History:  Procedure Laterality Date  . CESAREAN SECTION     Social History   Occupational History  . Not on file  Tobacco Use  . Smoking status: Never Smoker  . Smokeless tobacco: Never Used  Substance and Sexual Activity  . Alcohol use: Not on file  . Drug use: Not on file  . Sexual activity: Not on file

## 2019-01-07 ENCOUNTER — Encounter (INDEPENDENT_AMBULATORY_CARE_PROVIDER_SITE_OTHER): Payer: Self-pay | Admitting: Physical Medicine and Rehabilitation

## 2019-01-07 ENCOUNTER — Other Ambulatory Visit (INDEPENDENT_AMBULATORY_CARE_PROVIDER_SITE_OTHER): Payer: Self-pay | Admitting: Physical Medicine and Rehabilitation

## 2019-01-07 ENCOUNTER — Telehealth (INDEPENDENT_AMBULATORY_CARE_PROVIDER_SITE_OTHER): Payer: Self-pay | Admitting: *Deleted

## 2019-01-07 DIAGNOSIS — F411 Generalized anxiety disorder: Secondary | ICD-10-CM

## 2019-01-07 MED ORDER — DIAZEPAM 5 MG PO TABS: ORAL_TABLET | ORAL | 0 refills | Status: DC

## 2019-01-07 MED ORDER — MELOXICAM 7.5 MG PO TABS: 7.5000 mg | ORAL_TABLET | Freq: Two times a day (BID) | ORAL | 1 refills | Status: DC

## 2019-01-07 NOTE — Progress Notes (Signed)
Rhonda Henderson - 43 y.o. female MRN [id number]  Date of birth: August 05, 1976  Office Visit Note: Visit Date: 07/22/2018 PCP: Merrilee Seashore, MD Referred by: Merrilee Seashore, MD  Subjective: Chief Complaint  Patient presents with  . Lower Back - Pain, Tingling  . Right Leg - Pain, Tingling  . Left Leg - Pain, Tingling  . Right Thigh - Numbness, Pain  . Left Thigh - Numbness, Pain   HPI: Rhonda Henderson is a 43 y.o. female who comes in today For evaluation and chronic low back pain and pain down both legs with paresthesias in a nondermatomal fashion.  Complains of low back pain with bilateral leg pain with numbness and tingling really in a nondermatomal distribution.  She does not carry a diagnosis of peripheral polyneuropathy.  She is not diabetic.  MRI from 2017 did show endplate edema at E0-8 and there may be some mild lateral recess narrowing.  She is obese and pain is worse with standing and moving.  She rates her pain as an 8 out of 10.  There is been no new issues just no relief with recent injection or medication management.  She reports the pain began 3 years ago and we have seen her off and on.  She reports numbness and tingling in a nondermatomal distribution but no electrodiagnostic studies.  She reports that she is really always had these symptoms although mostly when I stand has been more back pain.  It is mostly right more than left leg interestingly.  She has no focal weakness or bowel or bladder changes.  Again worse with movement and standing.  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  Review of Systems  Constitutional: Negative for chills, fever, malaise/fatigue and weight loss.  HENT: Negative for hearing loss and sinus pain.   Eyes: Negative for blurred vision, double vision and photophobia.  Respiratory: Negative for cough and shortness of breath.   Cardiovascular: Negative for chest pain,  palpitations and leg swelling.  Gastrointestinal: Negative for abdominal pain, nausea and vomiting.  Genitourinary: Negative for flank pain.  Musculoskeletal: Positive for back pain. Negative for myalgias.  Skin: Negative for itching and rash.  Neurological: Positive for tingling. Negative for tremors, focal weakness and weakness.  Endo/Heme/Allergies: Negative.   Psychiatric/Behavioral: Negative for depression.  All other systems reviewed and are negative.  Otherwise per HPI.  Assessment & Plan: Visit Diagnoses:  1. Spondylosis without myelopathy or radiculopathy, lumbar region   2. Chronic bilateral low back pain without sciatica   3. Paresthesia of skin   4. Lumbar radiculopathy     Plan: Findings:  Chronic pain history of chronic mechanical low back pain wheezing in the past with good relief with injection of the facet joints.  MRI findings of facet joint arthritis mild to moderate without stenosis or focal nerve compression.  Does have some level of scoliosis.  She complains of this nondermatomal numbness and tingling more right than left however not necessarily consistent with a neuropathy.  She really has not endorsed that in the past but she has endorsing this today.  No trauma etc. no other worrisome signs or symptoms.  Exam fairly benign.  I think we will try facet joint blocks again and see how she does.  I see no facet joint syndrome or referral pain is felt by the patient to be more of a paresthesia.  If that does not  help we would look at a one-time epidural injection.  If she is just not getting relief we probably should review if it is time to update MRI findings to see if there is some level of stenosis.  Other avenue would be electrodiagnostic study of the lower limbs.  No medication changes and we did talk about activity modification.    Meds & Orders: No orders of the defined types were placed in this encounter.  No orders of the defined types were placed in this encounter.    Follow-up: Return for Bilateral facet joint blocks.   Procedures: No procedures performed  No notes on file   Clinical History: MRI LUMBAR SPINE WITHOUT CONTRAST  TECHNIQUE: Multiplanar, multisequence MR imaging of the lumbar spine was performed. No intravenous contrast was administered.  COMPARISON:  None available.  FINDINGS: Normal signal is present in the conus medullaris which terminates at L2, within normal limits. Edematous endplate marrow changes are present at L4-5, worse on the right. Levoconvex curvature of the lumbar spine is present at L4-5. There is rightward curvature at L2. Vertebral body heights alignment are maintained. Marrow signal is otherwise normal.  Limited imaging of the abdomen is within normal limits. There is no significant adenopathy.  The disc levels at L3-4 and above are normal.  L4-5: A broad-based disc protrusion is present. Mild facet hypertrophy is worse on the right. This results an mild right subarticular and foraminal stenosis. The left foramen is patent.  L5-S1: Mild to moderate facet hypertrophy is worse on the left. There is no significant disc protrusion or stenosis.  IMPRESSION: 1. Rightward disc protrusion and mild right subarticular and foraminal stenosis at L4-5. 2. Bilateral facet hypertrophy at L4-5 and L5-S1.   Electronically Signed   By: San Morelle M.D.   On: 10/28/2015 07:20   She reports that she has never smoked. She has never used smokeless tobacco. No results for input(s): HGBA1C, LABURIC in the last 8760 hours.  Objective:  VS:  HT:5\' 2"  (157.5 cm)   WT:236 lb (107 kg)  BMI:43.15    BP:135/85  HR:86bpm  TEMP:98.4 F (36.9 C)(Oral)  RESP:96 % Physical Exam Vitals signs and nursing note reviewed.  Constitutional:      General: She is not in acute distress.    Appearance: Normal appearance. She is well-developed. She is obese. She is not ill-appearing.  HENT:     Head:  Normocephalic and atraumatic.  Eyes:     Conjunctiva/sclera: Conjunctivae normal.     Pupils: Pupils are equal, round, and reactive to light.  Cardiovascular:     Rate and Rhythm: Normal rate.     Pulses: Normal pulses.  Pulmonary:     Effort: Pulmonary effort is normal.  Musculoskeletal:     Right lower leg: No edema.     Left lower leg: No edema.     Comments: Patient has pain with extension and facet joint loading across the lumbar spine.  She has no pain over the greater trochanters but she is tender really across the lower back and PSIS but no real focal tenderness.  She has a negative slump test bilaterally good distal strength without clonus.  Skin:    General: Skin is warm and dry.     Findings: No erythema or rash.  Neurological:     General: No focal deficit present.     Mental Status: She is alert and oriented to person, place, and time.     Sensory: No sensory deficit.  Motor: No abnormal muscle tone.     Coordination: Coordination normal.     Gait: Gait normal.  Psychiatric:        Mood and Affect: Mood normal.        Behavior: Behavior normal.     Ortho Exam Imaging: No results found.  Past Medical/Family/Surgical/Social History: Medications & Allergies reviewed per EMR, new medications updated. Patient Active Problem List   Diagnosis Date Noted  . Well woman exam 12/14/2013  . Fibroids 12/11/2013  . Obesity, morbid ([place]) 12/11/2013  . Plantar wart of left foot 10/06/2013  . Hypertension 01/15/2013  . Back pain 01/04/2013  . GERD (gastroesophageal reflux disease) 04/30/2012  . Breast nodule 04/30/2012  . Contraceptive management 01/23/2011  . Seasonal allergies 01/23/2011  . CONTACT DERMATITIS&OTHER ECZEMA DUE UNSPEC CAUSE 10/13/2010  . 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 OBESITY, NOS 12/12/2006   History reviewed. No pertinent past medical history. Family History  Problem Relation Age of Onset  . Hypertension Mother   . Diabetes Mother   .  Hypertension Father   . Diabetes Father   . Diabetes Sister   . Cancer Maternal Aunt        breast  . Breast cancer Maternal Aunt   . Cancer Maternal Uncle        lung  . Cancer Maternal Grandmother        stomach  . Cancer Paternal Grandmother        thyroid  . Cancer Paternal Grandfather        prostate   Past Surgical History:  Procedure Laterality Date  . CESAREAN SECTION     Social History   Occupational History  . Not on file  Tobacco Use  . Smoking status: Never Smoker  . Smokeless tobacco: Never Used  Substance and Sexual Activity  . Alcohol use: Not on file  . Drug use: Not on file  . Sexual activity: Not on file

## 2019-01-07 NOTE — Telephone Encounter (Signed)
rx for mobic and valium sent

## 2019-01-09 ENCOUNTER — Telehealth (INDEPENDENT_AMBULATORY_CARE_PROVIDER_SITE_OTHER): Payer: Self-pay | Admitting: *Deleted

## 2019-01-09 ENCOUNTER — Other Ambulatory Visit (INDEPENDENT_AMBULATORY_CARE_PROVIDER_SITE_OTHER): Payer: Self-pay | Admitting: Physical Medicine and Rehabilitation

## 2019-01-09 DIAGNOSIS — M47816 Spondylosis without myelopathy or radiculopathy, lumbar region: Secondary | ICD-10-CM

## 2019-01-09 DIAGNOSIS — M5441 Lumbago with sciatica, right side: Principal | ICD-10-CM

## 2019-01-09 DIAGNOSIS — G8929 Other chronic pain: Secondary | ICD-10-CM

## 2019-01-09 MED ORDER — MELOXICAM 15 MG PO TABS: 15.0000 mg | ORAL_TABLET | Freq: Every day | ORAL | 0 refills | Status: DC

## 2019-01-09 NOTE — Progress Notes (Signed)
Changed meloxicam from 7.5mg  bid to 15mg  daily due to insurance

## 2019-01-09 NOTE — Telephone Encounter (Signed)
Advised to pt.

## 2019-01-09 NOTE — Telephone Encounter (Signed)
Changed rx today

## 2019-01-19 ENCOUNTER — Encounter (INDEPENDENT_AMBULATORY_CARE_PROVIDER_SITE_OTHER): Payer: Self-pay | Admitting: Physical Medicine and Rehabilitation

## 2019-02-08 ENCOUNTER — Other Ambulatory Visit (INDEPENDENT_AMBULATORY_CARE_PROVIDER_SITE_OTHER): Payer: Self-pay | Admitting: Physical Medicine and Rehabilitation

## 2019-02-08 DIAGNOSIS — M47816 Spondylosis without myelopathy or radiculopathy, lumbar region: Secondary | ICD-10-CM

## 2019-02-08 DIAGNOSIS — G8929 Other chronic pain: Secondary | ICD-10-CM

## 2019-02-08 DIAGNOSIS — M5441 Lumbago with sciatica, right side: Principal | ICD-10-CM

## 2019-02-09 NOTE — Telephone Encounter (Signed)
Please advise 

## 2019-02-10 ENCOUNTER — Encounter (INDEPENDENT_AMBULATORY_CARE_PROVIDER_SITE_OTHER): Payer: Self-pay | Admitting: Physical Medicine and Rehabilitation

## 2019-02-25 ENCOUNTER — Other Ambulatory Visit (INDEPENDENT_AMBULATORY_CARE_PROVIDER_SITE_OTHER): Payer: Self-pay | Admitting: Physical Medicine and Rehabilitation

## 2019-02-25 NOTE — Telephone Encounter (Signed)
Please advise 

## 2019-02-27 ENCOUNTER — Encounter: Payer: Self-pay | Admitting: Physical Medicine and Rehabilitation

## 2019-02-27 ENCOUNTER — Other Ambulatory Visit: Payer: Self-pay

## 2019-02-27 ENCOUNTER — Ambulatory Visit: Payer: Self-pay

## 2019-02-27 ENCOUNTER — Ambulatory Visit: Payer: BC Managed Care – PPO | Admitting: Physical Medicine and Rehabilitation

## 2019-02-27 VITALS — BP 136/92 | HR 100

## 2019-02-27 DIAGNOSIS — M5416 Radiculopathy, lumbar region: Secondary | ICD-10-CM

## 2019-02-27 MED ORDER — BETAMETHASONE SOD PHOS & ACET 6 (3-3) MG/ML IJ SUSP
12.0000 mg | Freq: Once | INTRAMUSCULAR | Status: AC
  Administered 2019-02-27: 12 mg

## 2019-02-27 NOTE — Progress Notes (Signed)
 .  Numeric Pain Rating Scale and Functional Assessment Average Pain 8   In the last MONTH (on 0-10 scale) has pain interfered with the following?  1. General activity like being  able to carry out your everyday physical activities such as walking, climbing stairs, carrying groceries, or moving a chair?  Rating(7)   +Driver, -BT, -Dye Allergies.  

## 2019-03-31 NOTE — Progress Notes (Signed)
Rhonda Henderson - 42 y.o. female MRN [id number]  Date of birth: [birth year]  Office Visit Note: Visit Date: 02/27/2019 PCP: Merrilee Seashore, MD Referred by: Merrilee Seashore, MD  Subjective: Chief Complaint  Patient presents with  . Lower Back - Pain  . Right Leg - Pain   HPI:  Rhonda Henderson is a 43 y.o. female who comes in today For planned right L5 transforaminal epidural steroid injection for chronic worsening low back and right hip and leg pain and a pretty classic radicular pattern.  Prior L4 injection last fall was not very beneficial and she really was lost to follow-up through the course of the winter.  I did see her in March for an office visit and those notes can be reviewed.  Her injection was delayed do to coronavirus delays.  If she does not get relief with this injection I think the next course of action would be updated MRI of the lumbar spine and then depending on what that shows either working with physical therapy once again or perhaps surgical referral if it shows anything that surgical.  ROS Otherwise per HPI.  Assessment & Plan: Visit Diagnoses:  1. Lumbar radiculopathy     Plan: No additional findings.   Meds & Orders:  Meds ordered this encounter  Medications  . betamethasone acetate-betamethasone sodium phosphate (CELESTONE) injection 12 mg    Orders Placed This Encounter  Procedures  . XR C-ARM NO REPORT  . Epidural Steroid injection    Follow-up: Return if symptoms worsen or fail to improve.   Procedures: No procedures performed  Lumbosacral Transforaminal Epidural Steroid Injection - Sub-Pedicular Approach with Fluoroscopic Guidance  Patient: Rhonda Henderson      Date of Birth: 1975-12-15 MRN: [id number] PCP: Merrilee Seashore, MD      Visit Date: 02/27/2019   Universal Protocol:    Date/Time: 02/27/2019  Consent Given By: the patient  Position: PRONE  Additional Comments: Vital signs were monitored before and after the procedure.  Patient was prepped and draped in the usual sterile fashion. The correct patient, procedure, and site was verified.   Injection Procedure Details:  Procedure Site One Meds Administered:  Meds ordered this encounter  Medications  . betamethasone acetate-betamethasone sodium phosphate (CELESTONE) injection 12 mg    Laterality: Right  Location/Site:  L5-S1  Needle size: 22 G  Needle type: Spinal  Needle Placement: Transforaminal  Findings:    -Comments: Excellent flow of contrast along the nerve and into the epidural space.  Procedure Details: After squaring off the end-plates to get a true AP view, the C-arm was positioned so that an oblique view of the foramen as noted above was visualized. The target area is just inferior to the "nose of the scotty dog" or sub pedicular. The soft tissues overlying this structure were infiltrated with 2-3 ml. of 1% Lidocaine without Epinephrine.  The spinal needle was inserted toward the target using a "trajectory" view along the fluoroscope beam.  Under AP and lateral visualization, the needle was advanced so it did not puncture dura and was located close the 6 O'Clock position of the pedical in AP tracterory. Biplanar projections were used to confirm position. Aspiration was confirmed to be negative for CSF and/or blood. A 1-2 ml. volume of Isovue-250 was injected and flow of contrast was noted at each level. Radiographs were obtained for documentation purposes.   After attaining the desired flow of contrast documented above, a 0.5 to 1.0 ml test dose of 0.25% Marcaine was injected  into each respective transforaminal space.  The patient was observed for 90 seconds post injection.  After no sensory deficits were reported, and normal lower extremity motor function was noted,   the above injectate was administered so that equal amounts of the injectate were placed at each foramen (level) into the transforaminal epidural space.   Additional Comments:   The patient tolerated the procedure well Dressing: 2 x 2 sterile gauze and Band-Aid    Post-procedure details: Patient was observed during the procedure. Post-procedure instructions were reviewed.  Patient left the clinic in stable condition.    Clinical History: MRI LUMBAR SPINE WITHOUT CONTRAST  TECHNIQUE: Multiplanar, multisequence MR imaging of the lumbar spine was performed. No intravenous contrast was administered.  COMPARISON:  None available.  FINDINGS: Normal signal is present in the conus medullaris which terminates at L2, within normal limits. Edematous endplate marrow changes are present at L4-5, worse on the right. Levoconvex curvature of the lumbar spine is present at L4-5. There is rightward curvature at L2. Vertebral body heights alignment are maintained. Marrow signal is otherwise normal.  Limited imaging of the abdomen is within normal limits. There is no significant adenopathy.  The disc levels at L3-4 and above are normal.  L4-5: A broad-based disc protrusion is present. Mild facet hypertrophy is worse on the right. This results an mild right subarticular and foraminal stenosis. The left foramen is patent.  L5-S1: Mild to moderate facet hypertrophy is worse on the left. There is no significant disc protrusion or stenosis.  IMPRESSION: 1. Rightward disc protrusion and mild right subarticular and foraminal stenosis at L4-5. 2. Bilateral facet hypertrophy at L4-5 and L5-S1.   Electronically Signed   By: San Morelle M.D.   On: 10/28/2015 07:20     Objective:  VS:  HT:    WT:   BMI:     BP:(!) 136/92  HR:100bpm  TEMP: ( )  RESP:  Physical Exam  Ortho Exam Imaging: No results found.

## 2019-03-31 NOTE — Procedures (Signed)
Lumbosacral Transforaminal Epidural Steroid Injection - Sub-Pedicular Approach with Fluoroscopic Guidance  Patient: Rhonda Henderson      Date of Birth: 10-08-1976 MRN: [id number] PCP: Merrilee Seashore, MD      Visit Date: 02/27/2019   Universal Protocol:    Date/Time: 02/27/2019  Consent Given By: the patient  Position: PRONE  Additional Comments: Vital signs were monitored before and after the procedure. Patient was prepped and draped in the usual sterile fashion. The correct patient, procedure, and site was verified.   Injection Procedure Details:  Procedure Site One Meds Administered:  Meds ordered this encounter  Medications  . betamethasone acetate-betamethasone sodium phosphate (CELESTONE) injection 12 mg    Laterality: Right  Location/Site:  L5-S1  Needle size: 22 G  Needle type: Spinal  Needle Placement: Transforaminal  Findings:    -Comments: Excellent flow of contrast along the nerve and into the epidural space.  Procedure Details: After squaring off the end-plates to get a true AP view, the C-arm was positioned so that an oblique view of the foramen as noted above was visualized. The target area is just inferior to the "nose of the scotty dog" or sub pedicular. The soft tissues overlying this structure were infiltrated with 2-3 ml. of 1% Lidocaine without Epinephrine.  The spinal needle was inserted toward the target using a "trajectory" view along the fluoroscope beam.  Under AP and lateral visualization, the needle was advanced so it did not puncture dura and was located close the 6 O'Clock position of the pedical in AP tracterory. Biplanar projections were used to confirm position. Aspiration was confirmed to be negative for CSF and/or blood. A 1-2 ml. volume of Isovue-250 was injected and flow of contrast was noted at each level. Radiographs were obtained for documentation purposes.   After attaining the desired flow of contrast documented above, a 0.5 to  1.0 ml test dose of 0.25% Marcaine was injected into each respective transforaminal space.  The patient was observed for 90 seconds post injection.  After no sensory deficits were reported, and normal lower extremity motor function was noted,   the above injectate was administered so that equal amounts of the injectate were placed at each foramen (level) into the transforaminal epidural space.   Additional Comments:  The patient tolerated the procedure well Dressing: 2 x 2 sterile gauze and Band-Aid    Post-procedure details: Patient was observed during the procedure. Post-procedure instructions were reviewed.  Patient left the clinic in stable condition.

## 2019-04-30 ENCOUNTER — Other Ambulatory Visit (INDEPENDENT_AMBULATORY_CARE_PROVIDER_SITE_OTHER): Payer: Self-pay | Admitting: Physical Medicine and Rehabilitation

## 2019-04-30 DIAGNOSIS — M47816 Spondylosis without myelopathy or radiculopathy, lumbar region: Secondary | ICD-10-CM

## 2019-04-30 DIAGNOSIS — G8929 Other chronic pain: Secondary | ICD-10-CM

## 2019-04-30 NOTE — Telephone Encounter (Signed)
Please advise 

## 2019-04-30 NOTE — Telephone Encounter (Signed)
Need to see if last injection helped, if not much help needs MRI lspine and f/up, woul donly do Rx if we are working in htat direction, she can otherwise see if PCP will rx for her.

## 2019-05-01 ENCOUNTER — Other Ambulatory Visit: Payer: Self-pay | Admitting: Physical Medicine and Rehabilitation

## 2019-05-01 DIAGNOSIS — M48061 Spinal stenosis, lumbar region without neurogenic claudication: Secondary | ICD-10-CM

## 2019-05-01 DIAGNOSIS — M47816 Spondylosis without myelopathy or radiculopathy, lumbar region: Secondary | ICD-10-CM

## 2019-05-01 DIAGNOSIS — M5416 Radiculopathy, lumbar region: Secondary | ICD-10-CM

## 2019-05-01 NOTE — Telephone Encounter (Signed)
MRI Ordered per patient. She states that the last injection did not help. She will see what the cost of the MRI will be for her before she schedules. She will let us know if she decides not to have the MRI at this time.

## 2019-05-24 ENCOUNTER — Other Ambulatory Visit: Payer: Self-pay

## 2019-05-24 ENCOUNTER — Ambulatory Visit
Admission: RE | Admit: 2019-05-24 | Discharge: 2019-05-24 | Disposition: A | Discharge status: Home / Self Care | Payer: BC Managed Care – PPO | Source: Ambulatory Visit | Attending: Physical Medicine and Rehabilitation | Admitting: Physical Medicine and Rehabilitation

## 2019-05-24 DIAGNOSIS — M47816 Spondylosis without myelopathy or radiculopathy, lumbar region: Secondary | ICD-10-CM

## 2019-05-24 DIAGNOSIS — M5416 Radiculopathy, lumbar region: Secondary | ICD-10-CM

## 2019-05-24 DIAGNOSIS — M48061 Spinal stenosis, lumbar region without neurogenic claudication: Secondary | ICD-10-CM

## 2019-11-16 ENCOUNTER — Other Ambulatory Visit: Payer: Self-pay | Admitting: Internal Medicine

## 2019-11-16 DIAGNOSIS — Z1231 Encounter for screening mammogram for malignant neoplasm of breast: Secondary | ICD-10-CM

## 2019-12-29 ENCOUNTER — Ambulatory Visit
Admission: RE | Admit: 2019-12-29 | Discharge: 2019-12-29 | Disposition: A | Discharge status: Home / Self Care | Payer: BC Managed Care – PPO | Source: Ambulatory Visit | Attending: Internal Medicine | Admitting: Internal Medicine

## 2019-12-29 ENCOUNTER — Other Ambulatory Visit: Payer: Self-pay

## 2019-12-29 DIAGNOSIS — Z1231 Encounter for screening mammogram for malignant neoplasm of breast: Secondary | ICD-10-CM

## 2020-11-23 ENCOUNTER — Other Ambulatory Visit: Payer: Self-pay | Admitting: Family Medicine

## 2020-11-23 ENCOUNTER — Ambulatory Visit: Payer: Self-pay

## 2020-11-23 ENCOUNTER — Other Ambulatory Visit: Payer: Self-pay

## 2020-11-23 DIAGNOSIS — M25561 Pain in right knee: Secondary | ICD-10-CM

## 2020-11-30 ENCOUNTER — Other Ambulatory Visit: Payer: Self-pay | Admitting: Internal Medicine

## 2020-11-30 DIAGNOSIS — Z1231 Encounter for screening mammogram for malignant neoplasm of breast: Secondary | ICD-10-CM

## 2021-01-04 ENCOUNTER — Ambulatory Visit
Admission: RE | Admit: 2021-01-04 | Discharge: 2021-01-04 | Disposition: A | Discharge status: Home / Self Care | Payer: BC Managed Care – PPO | Source: Ambulatory Visit

## 2021-01-04 ENCOUNTER — Other Ambulatory Visit: Payer: Self-pay

## 2021-01-04 DIAGNOSIS — Z1231 Encounter for screening mammogram for malignant neoplasm of breast: Secondary | ICD-10-CM

## 2021-08-21 ENCOUNTER — Other Ambulatory Visit: Payer: Self-pay

## 2021-08-21 ENCOUNTER — Ambulatory Visit: Payer: Self-pay

## 2021-08-21 ENCOUNTER — Ambulatory Visit: Payer: BC Managed Care – PPO | Admitting: Family Medicine

## 2021-08-21 VITALS — BP 138/86 | HR 89 | Ht 62.0 in | Wt 260.0 lb

## 2021-08-21 DIAGNOSIS — M25511 Pain in right shoulder: Secondary | ICD-10-CM

## 2021-08-21 NOTE — Patient Instructions (Addendum)
Thank you for coming in today.   Please complete the exercises that the athletic trainer went over with you:  View at [email or web address] using code: [email or web address]  You received a steroid injection in your right shoulder today. Seek immediate medical attention if the joint becomes red, extremely painful, or is oozing fluid.   Recheck back in 6 weeks

## 2021-08-21 NOTE — Progress Notes (Signed)
I, Peterson Lombard, LAT, ATC acting as a scribe for Lynne Leader, MD.  Subjective:    CC: R upper arm pain  HPI: Pt is a 45 y/o female c/o R upper arm pain x about 1 month. Pt works as a Air cabin crew. Pt recalls she got her flu shot in the R arm and noticed the pain never resolved after the shot. Pt locates pain to the proximal portion of the R upper arm. Pt feels like there is a "divot" over the area of tenderness. Pt c/o pain waking her up at night.  Neck pain: no Radiates: sometimes UE Numbness/tingling: sometimes UE Weakness: no Aggravates: trying to sleep, horz ABD, ABD, IR Treatments tried: meloxicam x2 weeks, Tylenol, naproxen, IBU   Pertinent review of Systems: No fevers or chills  Relevant historical information: Hypertension.  Morbid obesity.   Objective:    Vitals:   08/21/21 1529  BP: 138/86  Pulse: 89  SpO2: 97%   General: Well Developed, well nourished, and in no acute distress.   MSK: Right shoulder.  Normal. Nontender. Range of motion abduction 130 degrees.  Internal rotation posterior iliac crest.  External rotation full. Strength 4/5 abduction and 4/5 external rotation.  5/5 internal rotation. Positive Hawkins and Neer's test.  Positive empty can test. Negative Yergason's and speeds test. Pulses capillary refill and sensation are intact distally.  Lab and Radiology Results  Procedure: Real-time Ultrasound Guided Injection of shoulder subacromial bursa Device: Philips Affiniti 50G Images permanently stored and available for review in PACS Ultrasound evaluation prior to injection reveals intact rotator cuff tendons with mild to moderate subacromial bursitis. Verbal informed consent obtained.  Discussed risks and benefits of procedure. Warned about infection bleeding damage to structures skin hypopigmentation and fat atrophy among others. Patient expresses understanding and agreement Time-out conducted.   Noted no overlying  erythema, induration, or other signs of local infection.   Skin prepped in a sterile fashion.   Local anesthesia: Topical Ethyl chloride.   With sterile technique and under real time ultrasound guidance: 40 mg of Kenalog and 2 mL of lidocaine injected into the acromial bursa. Fluid seen entering the bursa.   Completed without difficulty   Pain immediately resolved suggesting accurate placement of the medication.   Advised to call if fevers/chills, erythema, induration, drainage, or persistent bleeding.   Images permanently stored and available for review in the ultrasound unit.  Impression: Technically successful ultrasound guided injection.       Impression and Recommendations:    Assessment and Plan: 45 y.o. female with right shoulder pain thought to be due to subacromial bursitis.  Plan for subacromial bursa injection today as well as home exercise program taught in clinic today by ATC.  Check back about 6 weeks if not improved.  Could consider trial of physical therapy, or MRI at that point if needed.  PDMP not reviewed this encounter. Orders Placed This Encounter  Procedures   [place] LIMITED JOINT SPACE STRUCTURES UP RIGHT(NO LINKED CHARGES)    Standing Status:   Future    Number of Occurrences:   1    Standing Expiration Date:   02/18/2022    Order Specific Question:   Reason for Exam (SYMPTOM  OR DIAGNOSIS REQUIRED)    Answer:   right shoulder pain    Order Specific Question:   Preferred imaging location?    Answer:   [place]   No orders of the defined types were placed in this  encounter.   Discussed warning signs or symptoms. Please see discharge instructions. Patient expresses understanding.   The above documentation has been reviewed and is accurate and complete Lynne Leader, M.D.

## 2021-09-29 NOTE — Progress Notes (Deleted)
° °  I, Wendy Poet, LAT, ATC, am serving as scribe for Dr. Lynne Leader.  Rhonda Henderson is a 45 y.o. female who presents to [place] at [facility] today for f/u of R upper arm/shoulder pain that began after getting her flu shot.  She was last seen by Dr. Georgina Snell on 08/21/21 and had a R subacromial steroid injection.  She was also shown a HEP.  Today, pt reports    Pertinent review of systems: ***  Relevant historical information: ***   Exam:  There were no vitals taken for this visit. General: Well Developed, well nourished, and in no acute distress.   MSK: ***    Lab and Radiology Results No results found for this or any previous visit (from the past 72 hour(s)). No results found.     Assessment and Plan: 45 y.o. female with ***   PDMP not reviewed this encounter. No orders of the defined types were placed in this encounter.  No orders of the defined types were placed in this encounter.    Discussed warning signs or symptoms. Please see discharge instructions. Patient expresses understanding.   ***

## 2021-10-04 ENCOUNTER — Ambulatory Visit: Payer: BC Managed Care – PPO | Admitting: Family Medicine

## 2021-11-21 ENCOUNTER — Other Ambulatory Visit: Payer: Self-pay | Admitting: Internal Medicine

## 2021-11-21 DIAGNOSIS — Z1231 Encounter for screening mammogram for malignant neoplasm of breast: Secondary | ICD-10-CM

## 2022-01-08 ENCOUNTER — Ambulatory Visit
Admission: RE | Admit: 2022-01-08 | Discharge: 2022-01-08 | Disposition: A | Discharge status: Home / Self Care | Payer: BC Managed Care – PPO | Source: Ambulatory Visit | Attending: Internal Medicine | Admitting: Internal Medicine

## 2022-01-08 DIAGNOSIS — Z1231 Encounter for screening mammogram for malignant neoplasm of breast: Secondary | ICD-10-CM

## 2022-03-02 ENCOUNTER — Encounter (HOSPITAL_COMMUNITY): Payer: Self-pay

## 2022-03-02 ENCOUNTER — Ambulatory Visit (INDEPENDENT_AMBULATORY_CARE_PROVIDER_SITE_OTHER): Payer: BC Managed Care – PPO

## 2022-03-02 ENCOUNTER — Ambulatory Visit (HOSPITAL_COMMUNITY)
Admission: RE | Admit: 2022-03-02 | Discharge: 2022-03-02 | Disposition: A | Discharge status: Home / Self Care | Payer: BC Managed Care – PPO | Source: Ambulatory Visit

## 2022-03-02 VITALS — BP 141/83 | HR 100 | Temp 98.6°F | Resp 18

## 2022-03-02 DIAGNOSIS — M25522 Pain in left elbow: Secondary | ICD-10-CM | POA: Diagnosis not present

## 2022-03-02 MED ORDER — NAPROXEN 500 MG PO TABS: 500.0000 mg | ORAL_TABLET | Freq: Two times a day (BID) | ORAL | 0 refills | Status: AC

## 2022-03-02 MED ORDER — TIZANIDINE HCL 4 MG PO TABS: 4.0000 mg | ORAL_TABLET | Freq: Three times a day (TID) | ORAL | 0 refills | Status: DC | PRN

## 2022-03-02 NOTE — Discharge Instructions (Signed)
Your x-ray was normal.  Use the sling for the next couple of days but do not use it for more than a few days as it can make it harder for you to heal.  Use Naprosyn twice daily.  Do not take NSAIDs including aspirin, ibuprofen/Advil, naproxen/Aleve.  Take tizanidine up to 3 times a day.  This will make you sleepy so do not drive or drink alcohol with taking it.  If your symptoms are not proving please follow-up with sports medicine as we discussed; call to schedule an appointment.  If anything worsens return for reevaluation.
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
## 2022-03-07 ENCOUNTER — Ambulatory Visit (INDEPENDENT_AMBULATORY_CARE_PROVIDER_SITE_OTHER): Payer: BC Managed Care – PPO | Admitting: Family Medicine

## 2022-03-07 VITALS — BP 146/78 | Ht 62.0 in | Wt 236.0 lb

## 2022-03-07 DIAGNOSIS — M25512 Pain in left shoulder: Secondary | ICD-10-CM | POA: Diagnosis not present

## 2022-03-07 MED ORDER — DICLOFENAC SODIUM 75 MG PO TBEC: 75.0000 mg | DELAYED_RELEASE_TABLET | Freq: Two times a day (BID) | ORAL | 1 refills | Status: AC | PRN

## 2022-03-07 NOTE — Assessment & Plan Note (Signed)
Elbow pain is resolving, recommend discontinuing sling use to avoid stiffness.  Symptoms most consistent with strain of rotator cuff, will treat with diclofenac and home exercises for strengthening and range of motion of the cuff.  She has no weakness on exam today which is reassuring.  We will have her follow-up in 2 weeks, if she is not making significant improvement at that time, would consider doing a formal ultrasound of her left shoulder to evaluate the rotator cuff in full.  Also possible that she has some AC arthropathy and could consider further evaluation or injection of this in the future if not making improvement.

## 2022-03-07 NOTE — Patient Instructions (Signed)
Thank you for coming to see me today. It was a pleasure. Today we talked about:   You can stop using the sling.  I have sent a prescription for a new medicine.  Stop the old ones.  Don't take it with ibuprofen, Advil, Aleve.  Take this twice daily for the next 3 to 5 days, then you can take it twice daily as needed.  We have given you some exercises to do, try to do these about 5 times a week.  Do not push through pain, but pushing through soreness is okay.  Please follow-up with [place] in 2 weeks.  If you have any questions or concerns, please do not hesitate to call the office at [phone].  Best,   [place] Constable, DO [place]
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
## 2022-03-21 NOTE — Progress Notes (Addendum)
Rhonda Henderson is a 46 y.o. 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 ROS as above. Medications reviewed.  Exam:  BP (!) 154/83   Ht '5\' 2"'$  (1.575 m)   Wt 236 lb (107 kg)   BMI 43.16 kg/m  Gen: Well NAD MSK:  Left Shoulder: Inspection reveals no obvious deformity, atrophy, or asymmetry b/l. No bruising. No swelling TTP over [facility] joint on left. NV intact distally b/l  ULTRASOUND: Shoulder, left  Diagnostic complete ultrasound imaging obtained of patient's left shoulder.  - No obvious evidence of bony deformity or osteophyte development appreciated.  - Long head of the biceps tendon: No evidence of tendon thickening, calcification, subluxation, or tearing in short or long axis views. No edema or bullseye sign.  - Pec major insertion visualized without abnormality. - Subscapularis tendon: complete visualization across the width of the insertion point yielded no evidence of tendon thickening, calcification, or tears in the long axis view.  - Supraspinatus tendon: complete visualization across the width of the insertion point yielded few small calcifications at the most proximal insertion without significant tearing. There is mild bursal inflammation appreciated.  - Infraspinatus and teres minor tendons: visualization across the width of the insertion points yielded no evidence of tendon thickening, calcification, or tears in the long axis view.  [facility] Joint: Significant osteophytic ridging  noted worse on the acromial side with an effusion present.  There is tenderness to sono palpation. - Posterior Glenohumeral Joint visualized without abnormality. IMPRESSION: findings consistent with subacromial bursitis with mild supraspinatus tendinopathy and AC arthropathy with effusion.   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  She does have significant pain in both of these areas we discussed the risk and benefits of proceeding with injections in both of these areas to try to help with her pain is much as possible and continue home exercises.  She opts to proceed with these injections today.  We will have her follow-up in 2 to 3 weeks to assess how she is doing at that time.  Procedure performed: left subacromial corticosteroid injection; palpation guided  Consent obtained and verified. Time-out conducted. Noted no overlying erythema, induration, or other signs of local infection. The left posterior subacromial space was palpated and marked. The overlying skin was prepped in a sterile fashion. Topical analgesic spray: Ethyl chloride. Joint: left subacromial Needle: 25 gauge, 1.5 inch Completed without difficulty. Meds: 3 cc 1% lidocaine without epinephrine, 40 mg depo-medrol   Advised to call if fevers/chills, erythema, induration, drainage, or persistent bleeding.   Procedure performed: left AC joint corticosteroid injection; [place] assisted Procedure note:  Consent obtained and verified. Time-out conducted. Noted no overlying erythema, induration, or other signs of local infection. The left [facility] Joint was identified with ultrasound. The over overlying skin was prepped in a sterile fashion. Analgesia: 3cc lidocaine 1% without epinephrine, using 25g 1.5 in needle Joint: left AC Joint Needle: 22g  1.5 in needle Completed without difficulty. Meds: 0.5cc depomedrol '40mg'$ , 1.5cc lidocaine 1% without  epinephrine  Advised to call if fevers/chills, erythema, induration, drainage, or persistent bleeding.      [place] Constable, D.O.  PGY-4 [place] [facility]  03/22/2022 3:40 PM  I was the preceptor for this visit and available for immediate consultation Shellia Cleverly, DO

## 2022-03-22 ENCOUNTER — Ambulatory Visit (INDEPENDENT_AMBULATORY_CARE_PROVIDER_SITE_OTHER): Payer: BC Managed Care – PPO | Admitting: Family Medicine

## 2022-03-22 ENCOUNTER — Ambulatory Visit: Payer: Self-pay

## 2022-03-22 VITALS — BP 154/83 | Ht 62.0 in | Wt 236.0 lb

## 2022-03-22 DIAGNOSIS — M25512 Pain in left shoulder: Secondary | ICD-10-CM

## 2022-03-22 MED ORDER — METHYLPREDNISOLONE ACETATE 40 MG/ML IJ SUSP
20.0000 mg | Freq: Once | INTRAMUSCULAR | Status: AC
  Administered 2022-03-22: 20 mg via INTRA_ARTICULAR

## 2022-03-22 MED ORDER — METHYLPREDNISOLONE ACETATE 40 MG/ML IJ SUSP
40.0000 mg | Freq: Once | INTRAMUSCULAR | Status: AC
  Administered 2022-03-22: 40 mg via INTRA_ARTICULAR

## 2022-03-22 NOTE — Patient Instructions (Signed)
Thank you for coming to see me today. It was a pleasure. Today we talked about:   Today you received an injection with corticosteroid. This injection is usually done in response to pain and inflammation. There is some "numbing" medicine also in the shot so the injected area may be numb and feel really good for the next couple of hours. The numbing medicine usually wears off in 2-3 hours though, and then your pain level will be right back where it was before the injection.   The actually benefit from the steroid injection is usually noticed in 2-7 days. You may actually experience a small (as in 10%) INCREASE in pain in the first 24 hours---that is common.   Things to watch out for that you should contact us or a health care provider urgently would include: 1. Unusual (as in more than 10%) increase in pain 2. New fever > 101.5 3. New swelling or redness of the injected area.  4. Streaking of red lines around the area injected.   Continue your exercises.  Please follow-up with [place] in 2-3 weeks.  If you have any questions or concerns, please do not hesitate to call the office at [phone].  Best,   Arizona Constable, DO [place]

## 2022-03-22 NOTE — Assessment & Plan Note (Signed)
Findings consistent AC arthropathy and subacromial bursitis.  She does have significant pain in both of these areas we discussed the risk and benefits of proceeding with injections in both of these areas to try to help with her pain is much as possible and continue home exercises.  She opts to proceed with these injections today.  We will have her follow-up in 2 to 3 weeks to assess how she is doing at that time.  Procedure performed: left subacromial corticosteroid injection; palpation guided  Consent obtained and verified. Time-out conducted. Noted no overlying erythema, induration, or other signs of local infection. The left posterior subacromial space was palpated and marked. The overlying skin was prepped in a sterile fashion. Topical analgesic spray: Ethyl chloride. Joint: left subacromial Needle: 25 gauge, 1.5 inch Completed without difficulty. Meds: 3 cc 1% lidocaine without epinephrine, 40 mg depo-medrol   Advised to call if fevers/chills, erythema, induration, drainage, or persistent bleeding.   Procedure performed: left AC joint corticosteroid injection; [place] assisted Procedure note:  Consent obtained and verified. Time-out conducted. Noted no overlying erythema, induration, or other signs of local infection. The left [facility] Joint was identified with ultrasound. The over overlying skin was prepped in a sterile fashion. Analgesia: 3cc lidocaine 1% without epinephrine, using 25g 1.5 in needle Joint: left AC Joint Needle: 22g 1.5 in needle Completed without difficulty. Meds: 0.5cc depomedrol '40mg'$ , 1.5cc lidocaine 1% without epinephrine  Advised to call if fevers/chills, erythema, induration, drainage, or persistent bleeding.

## 2022-04-11 ENCOUNTER — Ambulatory Visit (INDEPENDENT_AMBULATORY_CARE_PROVIDER_SITE_OTHER): Payer: BC Managed Care – PPO | Admitting: Family Medicine

## 2022-04-11 VITALS — BP 141/73 | Ht 62.0 in | Wt 236.0 lb

## 2022-04-11 DIAGNOSIS — M25512 Pain in left shoulder: Secondary | ICD-10-CM | POA: Diagnosis not present

## 2022-04-11 MED ORDER — PREDNISONE 10 MG PO TABS: ORAL_TABLET | ORAL | 0 refills | Status: AC

## 2022-04-11 NOTE — Patient Instructions (Signed)
Thank you for coming to see me today. It was a pleasure. Today we talked about:   I have placed an order for neck x-rays.  Please go to [facility] to have this completed.  You do not need an appointment.  We will contact you with your results afterwards.   We will give you a course of prednisone to take over the next 6 days.  Take it in the morning and take it with food.  It can make you feel hyper.  Do not take this while you are taking ibuprofen, Advil, Aleve, or other anti-inflammatories.  You can resume taking other anti-inflammatories when you have completed the course.  You can stop your shoulder exercises for now as your exam was very reassuring.  We will give you new exercises for your neck.  Please follow-up with [place] in 1 month.  If you have any questions or concerns, please do not hesitate to call the office at [phone].  Best,   Ari[place] Constable, DO [place]

## 2022-04-11 NOTE — Progress Notes (Unsigned)
   Rhonda Henderson is a 46 y.o. female who presents to [facility] today for the following:  Left shoulder pain follow-up Last seen for the same on 6/8 At that time had an ultrasound performed that was consistent with subacromial bursitis with mild supraspinatus tendinopathy and AC arthropathy with effusion She received a subacromial injection as well as an AC injection Also advised to continue home exercises She reports that it did take some of the edge off the pain, but most of her pain continues to be in the posterior aspect of the left shoulder She reports that she sometimes has pain when she puts her elbow up on a chair as well, but this is getting somewhat better She has not had improvement with prior naproxen or Voltaren use   PMH reviewed.  ROS as above. Medications reviewed.  Exam:  BP (!) 141/73   Ht '5\' 2"'$  (1.575 m)   Wt 236 lb (107 kg)   BMI 43.16 kg/m  Gen: Well NAD MSK:  Left shoulder: Inspection reveals no obvious deformity, atrophy, or asymmetry b/l. No bruising. No swelling She has some mild tenderness palpation over the [facility] joint, majority of tenderness palpation is in the left upper trap/rhomboids Full ROM in flexion, abduction, internal/external rotation b/l NV intact distally b/l Special Tests:  - Impingement: Slightly positive Hawkins - Supraspinatous: Negative empty can - Infraspinatous/Teres Minor: 5/5 strength with ER - Subscapularis: 5/5 strength with IR - Biceps tendon: Negative Speeds - Labrum: Negative Obriens - AC Joint: Negative cross arm   Neck/Back: - Inspection: no gross deformity or asymmetry, swelling or ecchymosis - Palpation: No TTP  spinous process, TTP, there is significant tenderness palpation and hypertonicity throughout the left upper trap/rhomboid with few pressure points - ROM: full active ROM of the cervical spine with neck extension, rotation, flexion -mild pain with side bending - Strength: 5/5 wrist flexion, extension, biceps  flexion, triceps extension. OK sign, interosseus strength intact  - Neuro: sensation intact in the C5-C8 nerve root distribution b/l, 2+ C5-C7 reflexes - Special testing: Negative Spurling's     Assessment and Plan: 1) Acute pain of left shoulder This with patient that her shoulder exam is very reassuring today.  I do think that her injections were helpful, but she continues have pain mostly in the posterior aspect of the shoulder.  We did discuss that this could be from favoring this left shoulder, but could also consider irritation at the cervical level that is causing pain radiating to the posterior shoulder.  We will obtain cervical spine x-rays to evaluate for arthritic changes.  We will switch her to home exercises for cervical spine, given that her shoulder exam is so reassuring today.  Given that she is continue to have some much pain and failed other anti-inflammatories we will place her on a 6-day prednisone Dosepak.  She will follow-up in 1 month to see how she is doing at that time.   [place] Constable, D.O.  PGY-4 [facility]  04/11/2022 2:51 PM  Addendum:  I was the preceptor for this visit and available for immediate consultation.  Karlton Lemon MD [name]

## 2022-04-11 NOTE — Assessment & Plan Note (Signed)
This with patient that her shoulder exam is very reassuring today.  I do think that her injections were helpful, but she continues have pain mostly in the posterior aspect of the shoulder.  We did discuss that this could be from favoring this left shoulder, but could also consider irritation at the cervical level that is causing pain radiating to the posterior shoulder.  We will obtain cervical spine x-rays to evaluate for arthritic changes.  We will switch her to home exercises for cervical spine, given that her shoulder exam is so reassuring today.  Given that she is continue to have some much pain and failed other anti-inflammatories we will place her on a 6-day prednisone Dosepak.  She will follow-up in 1 month to see how she is doing at that time.

## 2022-04-26 ENCOUNTER — Ambulatory Visit
Admission: RE | Admit: 2022-04-26 | Discharge: 2022-04-26 | Disposition: A | Discharge status: Home / Self Care | Payer: BC Managed Care – PPO | Source: Ambulatory Visit | Attending: Family Medicine | Admitting: Family Medicine

## 2022-04-26 DIAGNOSIS — M25512 Pain in left shoulder: Secondary | ICD-10-CM

## 2022-05-07 ENCOUNTER — Encounter: Payer: Self-pay | Admitting: Family Medicine

## 2022-05-07 ENCOUNTER — Ambulatory Visit (INDEPENDENT_AMBULATORY_CARE_PROVIDER_SITE_OTHER): Payer: BC Managed Care – PPO | Admitting: Family Medicine

## 2022-05-07 VITALS — BP 136/90 | Ht 62.0 in | Wt 236.0 lb

## 2022-05-07 DIAGNOSIS — M25512 Pain in left shoulder: Secondary | ICD-10-CM | POA: Diagnosis not present

## 2022-05-07 DIAGNOSIS — M5412 Radiculopathy, cervical region: Secondary | ICD-10-CM | POA: Diagnosis not present
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
## 2022-05-07 NOTE — Patient Instructions (Addendum)
We will go ahead with MRIs of your cervical spine and shoulder. I will call you with results and next steps.  In order for you to be sedated for the MRIs you need to have them done at [facility]. Please call them at [phone] to schedule both exams. Let us know if you have any problems getting these done.

## 2022-05-21 ENCOUNTER — Encounter: Payer: Self-pay | Admitting: Family Medicine
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
## 2022-06-13 ENCOUNTER — Ambulatory Visit (INDEPENDENT_AMBULATORY_CARE_PROVIDER_SITE_OTHER): Payer: Self-pay | Admitting: Family Medicine

## 2022-06-13 VITALS — BP 135/77 | Ht 62.0 in | Wt 236.0 lb

## 2022-06-13 DIAGNOSIS — M25512 Pain in left shoulder: Secondary | ICD-10-CM

## 2022-06-13 DIAGNOSIS — M5412 Radiculopathy, cervical region: Secondary | ICD-10-CM

## 2022-06-13 NOTE — Progress Notes (Unsigned)
  Rhonda Henderson - 46 y.o. female MRN [id number]  Date of birth: [birth year]    CHIEF COMPLAINT:   L shoulder pain    SUBJECTIVE:   HPI:  Patient is a 46 y.o. female here for left shoulder pain.   6/28: Last seen for the same on 6/8 At that time had an ultrasound performed that was consistent with subacromial bursitis with mild supraspinatus tendinopathy and AC arthropathy with effusion She received a subacromial injection as well as an AC injection Also advised to continue home exercises She reports that it did take some of the edge off the pain, but most of her pain continues to be in the posterior aspect of the left shoulder She reports that she sometimes has pain when she puts her elbow up on a chair as well, but this is getting somewhat better She has not had improvement with prior naproxen or Voltaren use   7/24: Patient reports that unfortunately she has not improved since last visit. She has tried diclofenac and naproxen.  She did take the prednisone which she felt as only took the edge off but did not provide her significant relief. Her radiographs of her cervical spine only showed mild degenerative changes. She localizes the pain to the posterior and lateral left shoulder. Again notes that the injection she had in the side of the acromial space and the [facility] joint did not provide lasting relief. Pain only radiates down into the upper arm. Difficulty lying on her left side. Denies numbness or tingling.  8/30: Patient reports she is doing about the same in regards to her left shoulder and neck.  She has an upcoming MRI appointment on 9/5 at [facility] which will require sedation.  She is here to be evaluated in person within 30 days of her MRI, which is a requirement by her insurance.  ROS:     See HPI  PERTINENT  PMH / PSH FH / / SH:  Past Medical, Surgical, Social, and Family History Reviewed & Updated in the EMR.  Pertinent findings include:  none  OBJECTIVE: BP  135/77   Ht '5\' 2"'$  (1.575 m)   Wt 236 lb (107 kg)   BMI 43.16 kg/m   Physical Exam:  Vital signs are reviewed.  GEN: Alert and oriented, NAD Pulm: Breathing unlabored PSY: normal mood, congruent affect  MSK: Left shoulder: No swelling, ecchymoses.  No gross deformity. No TTP. FROM. Negative Hawkins, Neers. Negative Yergasons. Strength 5/5 with empty can and resisted internal/external rotation. NV intact distally.  ASSESSMENT & PLAN:  1. L Shoulder Pain -Patient will proceed with MRI of left shoulder and MRI of cervical spine as scheduled.  She has now been evaluated in person within 30 days of her upcoming MRI that requires sedation.  There are no known medical contraindications for MRI under sedation.  She will follow-up with [place] after MRI results are available.   Dortha Kern, MD PGY-4, Sports Medicine Fellow [place]

## 2022-06-14 ENCOUNTER — Other Ambulatory Visit: Payer: Self-pay

## 2022-06-14 ENCOUNTER — Encounter: Payer: Self-pay | Admitting: Family Medicine

## 2022-06-14 ENCOUNTER — Encounter (HOSPITAL_COMMUNITY): Payer: Self-pay | Admitting: *Deleted

## 2022-06-14 NOTE — Progress Notes (Signed)
Rhonda Henderson denies chest pain or shortness of breath.  Patient denies having any s/s of Covid in her household, also denies any known exposure to Covid.   Rhonda Henderson's PCP is Dr. Roselle Locus.

## 2022-06-18 NOTE — Anesthesia Preprocedure Evaluation (Addendum)
Anesthesia Evaluation  Patient identified by MRN, date of birth, ID band Patient awake    Reviewed: Allergy & Precautions, NPO status , Patient's Chart, lab work & pertinent test results  Airway Mallampati: I  TM Distance: >3 FB Neck ROM: Full    Dental no notable dental hx. (+) Teeth Intact, Dental Advisory Given   Pulmonary neg pulmonary ROS,    Pulmonary exam normal breath sounds clear to auscultation       Cardiovascular hypertension, Pt. on medications Normal cardiovascular exam Rhythm:Regular Rate:Normal     Neuro/Psych negative neurological ROS  negative psych ROS   GI/Hepatic Neg liver ROS, GERD  ,  Endo/Other  Morbid obesity (BMI 43)  Renal/GU negative Renal ROS  negative genitourinary   Musculoskeletal  (+) Arthritis ,   Abdominal   Peds  Hematology negative hematology ROS (+)   Anesthesia Other Findings   Reproductive/Obstetrics                            Anesthesia Physical Anesthesia Plan  ASA: 2  Anesthesia Plan: General   Post-op Pain Management:    Induction: Intravenous  PONV Risk Score and Plan: 3 and Ondansetron, Dexamethasone and Midazolam  Airway Management Planned: LMA and Oral ETT  Additional Equipment:   Intra-op Plan:   Post-operative Plan: Extubation in OR  Informed Consent: I have reviewed the patients History and Physical, chart, labs and discussed the procedure including the risks, benefits and alternatives for the proposed anesthesia with the patient or authorized representative who has indicated his/her understanding and acceptance.     Dental advisory given  Plan Discussed with: CRNA  Anesthesia Plan Comments:         Anesthesia Quick Evaluation

## 2022-06-19 ENCOUNTER — Encounter (HOSPITAL_COMMUNITY)
Admission: RE | Disposition: A | Discharge status: Home / Self Care | Payer: Self-pay | Source: Home / Self Care | Attending: Anesthesiology

## 2022-06-19 ENCOUNTER — Encounter (HOSPITAL_COMMUNITY): Payer: Self-pay | Admitting: Anesthesiology

## 2022-06-19 ENCOUNTER — Ambulatory Visit (HOSPITAL_COMMUNITY)
Admission: RE | Admit: 2022-06-19 | Discharge: 2022-06-19 | Disposition: A | Discharge status: Home / Self Care | Payer: BC Managed Care – PPO | Source: Ambulatory Visit | Attending: Family Medicine | Admitting: Family Medicine

## 2022-06-19 ENCOUNTER — Ambulatory Visit (HOSPITAL_COMMUNITY)
Admission: RE | Admit: 2022-06-19 | Discharge: 2022-06-19 | Disposition: A | Discharge status: Home / Self Care | Payer: BC Managed Care – PPO | Attending: Anesthesiology | Admitting: Anesthesiology

## 2022-06-19 ENCOUNTER — Ambulatory Visit (HOSPITAL_COMMUNITY): Payer: BC Managed Care – PPO | Admitting: Anesthesiology

## 2022-06-19 ENCOUNTER — Other Ambulatory Visit: Payer: Self-pay

## 2022-06-19 DIAGNOSIS — M67814 Other specified disorders of tendon, left shoulder: Secondary | ICD-10-CM | POA: Insufficient documentation

## 2022-06-19 DIAGNOSIS — K219 Gastro-esophageal reflux disease without esophagitis: Secondary | ICD-10-CM | POA: Insufficient documentation

## 2022-06-19 DIAGNOSIS — M5412 Radiculopathy, cervical region: Secondary | ICD-10-CM

## 2022-06-19 DIAGNOSIS — Z6841 Body Mass Index (BMI) 40.0 and over, adult: Secondary | ICD-10-CM | POA: Insufficient documentation

## 2022-06-19 DIAGNOSIS — M25512 Pain in left shoulder: Secondary | ICD-10-CM

## 2022-06-19 DIAGNOSIS — M19012 Primary osteoarthritis, left shoulder: Secondary | ICD-10-CM | POA: Insufficient documentation

## 2022-06-19 DIAGNOSIS — I10 Essential (primary) hypertension: Secondary | ICD-10-CM | POA: Diagnosis not present

## 2022-06-19 HISTORY — DX: Essential (primary) hypertension: I10

## 2022-06-19 HISTORY — DX: Unspecified osteoarthritis, unspecified site: M19.90

## 2022-06-19 HISTORY — PX: RADIOLOGY WITH ANESTHESIA: SHX6223

## 2022-06-19 LAB — POCT I-STAT, CHEM 8
BUN: 19 mg/dL (ref 6–20)
Calcium, Ion: 1.16 mmol/L (ref 1.15–1.40)
Chloride: 104 mmol/L (ref 98–111)
Creatinine, Ser: 0.8 mg/dL (ref 0.44–1.00)
Glucose, Bld: 106 mg/dL — ABNORMAL HIGH (ref 70–99)
HCT: 40 % (ref 36.0–46.0)
Hemoglobin: 13.6 g/dL (ref 12.0–15.0)
Potassium: 3.1 mmol/L — ABNORMAL LOW (ref 3.5–5.1)
Sodium: 140 mmol/L (ref 135–145)
TCO2: 25 mmol/L (ref 22–32)

## 2022-06-19 LAB — POCT PREGNANCY, URINE: Preg Test, Ur: NEGATIVE

## 2022-06-19 SURGERY — MRI WITH ANESTHESIA
Anesthesia: General

## 2022-06-19 MED ORDER — ORAL CARE MOUTH RINSE: 15.0000 mL | Freq: Once | OROMUCOSAL | Status: AC

## 2022-06-19 MED ORDER — CHLORHEXIDINE GLUCONATE 0.12 % MT SOLN: 15.0000 mL | Freq: Once | OROMUCOSAL | Status: AC

## 2022-06-19 MED ORDER — LACTATED RINGERS IV SOLN: INTRAVENOUS | Status: DC

## 2022-06-19 MED ORDER — CHLORHEXIDINE GLUCONATE 0.12 % MT SOLN
OROMUCOSAL | Status: AC
  Administered 2022-06-19: 15 mL via OROMUCOSAL
  Filled 2022-06-19: qty 15

## 2022-06-19 MED ORDER — LIDOCAINE 2% (20 MG/ML) 5 ML SYRINGE
INTRAMUSCULAR | Status: DC | PRN
  Administered 2022-06-19: 100 mg via INTRAVENOUS

## 2022-06-19 MED ORDER — PROPOFOL 10 MG/ML IV BOLUS
INTRAVENOUS | Status: AC
  Filled 2022-06-19: qty 20

## 2022-06-19 MED ORDER — PROPOFOL 10 MG/ML IV BOLUS
INTRAVENOUS | Status: DC | PRN
  Administered 2022-06-19: 200 mg via INTRAVENOUS

## 2022-06-19 NOTE — Anesthesia Procedure Notes (Signed)
Procedure Name: LMA Insertion Date/Time: 06/19/2022 9:33 AM  Performed by: Georgia Duff, CRNAPre-anesthesia Checklist: Patient identified, Emergency Drugs available, Suction available and Patient being monitored Patient Re-evaluated:Patient Re-evaluated prior to induction Oxygen Delivery Method: Circle System Utilized Preoxygenation: Pre-oxygenation with 100% oxygen Induction Type: IV induction Ventilation: Mask ventilation without difficulty LMA: LMA inserted LMA Size: 4.0 Number of attempts: 1 Airway Equipment and Method: Bite block Placement Confirmation: positive ETCO2 Tube secured with: Tape Dental Injury: Teeth and Oropharynx as per pre-operative assessment

## 2022-06-19 NOTE — Transfer of Care (Signed)
Immediate Anesthesia Transfer of Care Note  Patient: Rhonda Henderson  Procedure(s) Performed: MRI WITH CERVICAL SPINE WITHOUT CONTRAST,LEFT SHOULDER WITHOUT CONTRAST  Patient Location: PACU  Anesthesia Type:General  Level of Consciousness: drowsy and patient cooperative  Airway & Oxygen Therapy: Patient Spontanous Breathing  Post-op Assessment: Report given to RN and Post -op Vital signs reviewed and stable  Post vital signs: Reviewed and stable  Last Vitals:  Vitals Value Taken Time  BP 147/94 06/19/22 1046  Temp 36.8 C 06/19/22 1045  Pulse 86 06/19/22 1055  Resp 13 06/19/22 1055  SpO2 98 % 06/19/22 1055  Vitals shown include unvalidated device data.  Last Pain:  Vitals:   06/19/22 0710  TempSrc:   PainSc: 0-No pain         Complications: No notable events documented.

## 2022-06-19 NOTE — Anesthesia Postprocedure Evaluation (Signed)
Anesthesia Post Note  Patient: Rhonda Henderson  Procedure(s) Performed: MRI WITH CERVICAL SPINE WITHOUT CONTRAST,LEFT SHOULDER WITHOUT CONTRAST     Patient location during evaluation: PACU Anesthesia Type: General Level of consciousness: awake and alert Pain management: pain level controlled Vital Signs Assessment: post-procedure vital signs reviewed and stable Respiratory status: spontaneous breathing, nonlabored ventilation, respiratory function stable and patient connected to nasal cannula oxygen Cardiovascular status: blood pressure returned to baseline and stable Postop Assessment: no apparent nausea or vomiting Anesthetic complications: no   No notable events documented.  Last Vitals:  Vitals:   06/19/22 1100 06/19/22 1115  BP: (!) 133/57 (!) 142/67  Pulse: 80 78  Resp: 17 18  Temp:  36.8 C  SpO2: 96% 100%    Last Pain:  Vitals:   06/19/22 1115  TempSrc:   PainSc: 0-No pain                 Julyana Woolverton L Arlin Savona

## 2022-06-20 ENCOUNTER — Encounter (HOSPITAL_COMMUNITY): Payer: Self-pay | Admitting: Radiology

## 2022-06-27 ENCOUNTER — Other Ambulatory Visit: Payer: Self-pay | Admitting: *Deleted

## 2022-06-27 DIAGNOSIS — M25512 Pain in left shoulder: Secondary | ICD-10-CM

## 2022-07-09 ENCOUNTER — Ambulatory Visit: Payer: BC Managed Care – PPO | Admitting: Family Medicine

## 2022-07-14 ENCOUNTER — Other Ambulatory Visit: Payer: Self-pay

## 2022-07-14 ENCOUNTER — Ambulatory Visit (HOSPITAL_BASED_OUTPATIENT_CLINIC_OR_DEPARTMENT_OTHER): Payer: BC Managed Care – PPO | Attending: Family Medicine | Admitting: Physical Therapy

## 2022-07-14 ENCOUNTER — Encounter (HOSPITAL_BASED_OUTPATIENT_CLINIC_OR_DEPARTMENT_OTHER): Payer: Self-pay | Admitting: Physical Therapy

## 2022-07-14 DIAGNOSIS — M25512 Pain in left shoulder: Secondary | ICD-10-CM

## 2022-07-14 DIAGNOSIS — M542 Cervicalgia: Secondary | ICD-10-CM
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
## 2022-07-27 ENCOUNTER — Ambulatory Visit (HOSPITAL_BASED_OUTPATIENT_CLINIC_OR_DEPARTMENT_OTHER): Payer: BC Managed Care – PPO | Attending: Family Medicine | Admitting: Physical Therapy

## 2022-07-27 DIAGNOSIS — M25512 Pain in left shoulder: Secondary | ICD-10-CM | POA: Insufficient documentation

## 2022-07-27 DIAGNOSIS — M542 Cervicalgia: Secondary | ICD-10-CM | POA: Diagnosis present
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
## 2022-07-28 ENCOUNTER — Encounter (HOSPITAL_BASED_OUTPATIENT_CLINIC_OR_DEPARTMENT_OTHER): Payer: Self-pay | Admitting: Physical Therapy

## 2022-07-31 ENCOUNTER — Ambulatory Visit (HOSPITAL_BASED_OUTPATIENT_CLINIC_OR_DEPARTMENT_OTHER): Payer: BC Managed Care – PPO | Admitting: Physical Therapy

## 2022-07-31 DIAGNOSIS — M25512 Pain in left shoulder: Secondary | ICD-10-CM

## 2022-07-31 DIAGNOSIS — M542 Cervicalgia: Secondary | ICD-10-CM
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
## 2022-08-01 ENCOUNTER — Encounter (HOSPITAL_BASED_OUTPATIENT_CLINIC_OR_DEPARTMENT_OTHER): Payer: Self-pay | Admitting: Physical Therapy

## 2022-08-02 ENCOUNTER — Encounter (HOSPITAL_BASED_OUTPATIENT_CLINIC_OR_DEPARTMENT_OTHER): Payer: Self-pay | Admitting: Physical Therapy

## 2022-08-03 ENCOUNTER — Ambulatory Visit (HOSPITAL_BASED_OUTPATIENT_CLINIC_OR_DEPARTMENT_OTHER): Payer: BC Managed Care – PPO | Admitting: Physical Therapy

## 2022-08-03 ENCOUNTER — Encounter (HOSPITAL_BASED_OUTPATIENT_CLINIC_OR_DEPARTMENT_OTHER): Payer: Self-pay | Admitting: Physical Therapy

## 2022-08-03 DIAGNOSIS — M542 Cervicalgia: Secondary | ICD-10-CM

## 2022-08-03 DIAGNOSIS — M25512 Pain in left shoulder: Secondary | ICD-10-CM
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
## 2022-08-04 ENCOUNTER — Encounter (HOSPITAL_BASED_OUTPATIENT_CLINIC_OR_DEPARTMENT_OTHER): Payer: Self-pay | Admitting: Physical Therapy

## 2022-08-07 ENCOUNTER — Encounter (HOSPITAL_BASED_OUTPATIENT_CLINIC_OR_DEPARTMENT_OTHER): Payer: Self-pay | Admitting: Physical Therapy

## 2022-08-07 ENCOUNTER — Ambulatory Visit (HOSPITAL_BASED_OUTPATIENT_CLINIC_OR_DEPARTMENT_OTHER): Payer: BC Managed Care – PPO | Admitting: Physical Therapy

## 2022-08-07 DIAGNOSIS — M25512 Pain in left shoulder: Secondary | ICD-10-CM

## 2022-08-07 DIAGNOSIS — M542 Cervicalgia: Secondary | ICD-10-CM
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
## 2022-08-10 ENCOUNTER — Ambulatory Visit (HOSPITAL_BASED_OUTPATIENT_CLINIC_OR_DEPARTMENT_OTHER): Payer: BC Managed Care – PPO | Admitting: Physical Therapy

## 2022-08-10 ENCOUNTER — Encounter (HOSPITAL_BASED_OUTPATIENT_CLINIC_OR_DEPARTMENT_OTHER): Payer: Self-pay | Admitting: Physical Therapy

## 2022-08-10 DIAGNOSIS — M25512 Pain in left shoulder: Secondary | ICD-10-CM

## 2022-08-10 DIAGNOSIS — M542 Cervicalgia: Secondary | ICD-10-CM
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
## 2022-08-14 ENCOUNTER — Ambulatory Visit (HOSPITAL_BASED_OUTPATIENT_CLINIC_OR_DEPARTMENT_OTHER): Payer: BC Managed Care – PPO | Admitting: Physical Therapy

## 2022-08-14 ENCOUNTER — Encounter (HOSPITAL_BASED_OUTPATIENT_CLINIC_OR_DEPARTMENT_OTHER): Payer: Self-pay | Admitting: Physical Therapy

## 2022-08-14 DIAGNOSIS — M542 Cervicalgia: Secondary | ICD-10-CM

## 2022-08-14 DIAGNOSIS — M25512 Pain in left shoulder: Secondary | ICD-10-CM
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
## 2022-08-15 ENCOUNTER — Encounter (HOSPITAL_BASED_OUTPATIENT_CLINIC_OR_DEPARTMENT_OTHER): Payer: Self-pay | Admitting: Physical Therapy

## 2022-08-16 ENCOUNTER — Ambulatory Visit (HOSPITAL_BASED_OUTPATIENT_CLINIC_OR_DEPARTMENT_OTHER): Payer: BC Managed Care – PPO | Admitting: Physical Therapy

## 2022-08-21 ENCOUNTER — Encounter (HOSPITAL_BASED_OUTPATIENT_CLINIC_OR_DEPARTMENT_OTHER): Payer: Self-pay | Admitting: Physical Therapy

## 2022-08-21 ENCOUNTER — Ambulatory Visit (HOSPITAL_BASED_OUTPATIENT_CLINIC_OR_DEPARTMENT_OTHER): Payer: BC Managed Care – PPO | Attending: Family Medicine | Admitting: Physical Therapy

## 2022-08-21 DIAGNOSIS — M25512 Pain in left shoulder: Secondary | ICD-10-CM | POA: Insufficient documentation

## 2022-08-21 DIAGNOSIS — M542 Cervicalgia: Secondary | ICD-10-CM | POA: Insufficient documentation
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
## 2022-08-23 ENCOUNTER — Ambulatory Visit (HOSPITAL_BASED_OUTPATIENT_CLINIC_OR_DEPARTMENT_OTHER): Payer: BC Managed Care – PPO | Admitting: Physical Therapy

## 2022-08-23 ENCOUNTER — Encounter (HOSPITAL_BASED_OUTPATIENT_CLINIC_OR_DEPARTMENT_OTHER): Payer: Self-pay | Admitting: Physical Therapy

## 2022-08-23 DIAGNOSIS — M542 Cervicalgia: Secondary | ICD-10-CM

## 2022-08-23 DIAGNOSIS — M25512 Pain in left shoulder: Secondary | ICD-10-CM | POA: Diagnosis not present
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
## 2022-08-28 ENCOUNTER — Ambulatory Visit (HOSPITAL_BASED_OUTPATIENT_CLINIC_OR_DEPARTMENT_OTHER): Payer: BC Managed Care – PPO | Admitting: Physical Therapy

## 2022-08-28 DIAGNOSIS — M542 Cervicalgia: Secondary | ICD-10-CM

## 2022-08-28 DIAGNOSIS — M25512 Pain in left shoulder: Secondary | ICD-10-CM
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
## 2022-08-29 ENCOUNTER — Encounter (HOSPITAL_BASED_OUTPATIENT_CLINIC_OR_DEPARTMENT_OTHER): Payer: Self-pay | Admitting: Physical Therapy

## 2022-08-30 ENCOUNTER — Ambulatory Visit (HOSPITAL_BASED_OUTPATIENT_CLINIC_OR_DEPARTMENT_OTHER): Payer: BC Managed Care – PPO | Admitting: Physical Therapy

## 2022-08-30 DIAGNOSIS — M25512 Pain in left shoulder: Secondary | ICD-10-CM

## 2022-08-30 DIAGNOSIS — M542 Cervicalgia: Secondary | ICD-10-CM
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
## 2022-08-31 ENCOUNTER — Encounter (HOSPITAL_BASED_OUTPATIENT_CLINIC_OR_DEPARTMENT_OTHER): Payer: Self-pay | Admitting: Physical Therapy

## 2022-09-05 ENCOUNTER — Ambulatory Visit (INDEPENDENT_AMBULATORY_CARE_PROVIDER_SITE_OTHER): Payer: BC Managed Care – PPO | Admitting: Family Medicine

## 2022-09-05 VITALS — BP 137/78 | Ht 62.0 in | Wt 236.0 lb

## 2022-09-05 DIAGNOSIS — M25512 Pain in left shoulder: Secondary | ICD-10-CM

## 2022-09-05 NOTE — Patient Instructions (Addendum)
Continue physical therapy visits you have scheduled through 12/28. After that just do home exercises 3 times a week for the next 6 weeks. If you've reached a plateau still I would consider acupuncture of trigger point injection, just call us if this is the case. Otherwise if you're doing well follow up as needed.
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
## 2022-09-06 ENCOUNTER — Encounter: Payer: Self-pay | Admitting: Family Medicine

## 2022-09-21 ENCOUNTER — Encounter (HOSPITAL_BASED_OUTPATIENT_CLINIC_OR_DEPARTMENT_OTHER): Payer: BC Managed Care – PPO | Admitting: Physical Therapy

## 2022-09-28 ENCOUNTER — Ambulatory Visit (HOSPITAL_BASED_OUTPATIENT_CLINIC_OR_DEPARTMENT_OTHER): Payer: BC Managed Care – PPO | Admitting: Physical Therapy

## 2022-10-04 ENCOUNTER — Ambulatory Visit (HOSPITAL_BASED_OUTPATIENT_CLINIC_OR_DEPARTMENT_OTHER): Payer: BC Managed Care – PPO | Attending: Family Medicine | Admitting: Physical Therapy

## 2022-10-04 DIAGNOSIS — M542 Cervicalgia: Secondary | ICD-10-CM | POA: Diagnosis present

## 2022-10-04 DIAGNOSIS — M25512 Pain in left shoulder: Secondary | ICD-10-CM | POA: Diagnosis present
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
## 2022-10-11 ENCOUNTER — Encounter (HOSPITAL_BASED_OUTPATIENT_CLINIC_OR_DEPARTMENT_OTHER): Payer: BC Managed Care – PPO | Admitting: Physical Therapy

## 2022-11-12 ENCOUNTER — Ambulatory Visit: Payer: Self-pay

## 2022-11-12 ENCOUNTER — Ambulatory Visit (INDEPENDENT_AMBULATORY_CARE_PROVIDER_SITE_OTHER): Payer: BC Managed Care – PPO | Admitting: Family Medicine

## 2022-11-12 VITALS — BP 130/86 | Ht 62.0 in | Wt 236.0 lb

## 2022-11-12 DIAGNOSIS — M25512 Pain in left shoulder: Secondary | ICD-10-CM

## 2022-11-12 DIAGNOSIS — G8929 Other chronic pain: Secondary | ICD-10-CM

## 2022-11-12 MED ORDER — METHYLPREDNISOLONE ACETATE 40 MG/ML IJ SUSP
40.0000 mg | Freq: Once | INTRAMUSCULAR | Status: AC
  Administered 2022-11-12: 40 mg via INTRA_ARTICULAR

## 2022-11-12 NOTE — Progress Notes (Signed)
PCP: Georgianne Fick, MD  Subjective:   HPI: Patient is a 47 y.o. female here for left shoulder follow-up.  Since last visit, has completed PT for shoulders. Continues to have L shoulder pain described as tight along lateral shoulder and over scapular area. Pain occurs all at once as opposed to shooting down the arm. Symptoms resolved at one point where she could sleep on her L side, but now has returned to where she cannot. Pain can wake her up at night. Using L arm can be uncomfortable with some everyday tasks including putting on clothing. Oral medications have not helped. Denies issues with distal arms. No numbness or tingling.  Done extensive physical therapy.  Past Medical History:  Diagnosis Date   Arthritis    Hypertension     Current Outpatient Medications on File Prior to Visit  Medication Sig Dispense Refill   Ascorbic Acid (VITAMIN C) 500 MG CHEW Chew 1,000 mg by mouth daily. Chewable     diclofenac (VOLTAREN) 75 MG EC tablet Take 1 tablet (75 mg total) by mouth 2 (two) times daily as needed. (Patient not taking: Reported on 06/12/2022) 60 tablet 1   ferrous sulfate 325 (65 FE) MG tablet Take 325 mg by mouth daily.     Multiple Vitamins-Minerals (HAIR/SKIN/NAILS/BIOTIN PO) Take 2 each by mouth daily.     naproxen (NAPROSYN) 500 MG tablet Take 1 tablet (500 mg total) by mouth 2 (two) times daily. (Patient not taking: Reported on 06/12/2022) 30 tablet 0   norgestimate-ethinyl estradiol (ESTARYLLA) 0.25-35 MG-MCG tablet Take 1 tablet by mouth daily.     Potassium 99 MG TABS Take 99 mg by mouth daily.     predniSONE (DELTASONE) 10 MG tablet Use as directed per doctors orders. (Patient not taking: Reported on 06/12/2022) 21 tablet 0   tiZANidine (ZANAFLEX) 4 MG tablet Take 1 tablet (4 mg total) by mouth every 8 (eight) hours as needed for muscle spasms. (Patient taking differently: Take 4 mg by mouth at bedtime.) 30 tablet 0   triamterene-hydrochlorothiazide (MAXZIDE-25) 37.5-25 MG  tablet Take 1 tablet by mouth daily.     No current facility-administered medications on file prior to visit.    Past Surgical History:  Procedure Laterality Date   CESAREAN SECTION     RADIOLOGY WITH ANESTHESIA N/A 06/19/2022   Procedure: MRI WITH CERVICAL SPINE WITHOUT CONTRAST,LEFT SHOULDER WITHOUT CONTRAST;  Surgeon: Radiologist, Medication, MD;  Location: [facility];  Service: Radiology;  Laterality: N/A;    Allergies  Allergen Reactions   Fluconazole Rash    BP 130/86   Ht 5\' 2"  (1.575 m)   Wt 236 lb (107 kg)   BMI 43.16 kg/m       No data to display              No data to display              Objective:  Physical Exam:  Gen: NAD, comfortable in exam room  MSK L shoulder: No swelling, ecchymoses.  No gross deformity. Full passive ROM. Active abduction ROM limited. TTP along L mid trapezius. L trapezius in spasm.  No TTP to [facility] joint. Negative painful arc and Neers. Positive Hawkins, empty can tests.  Strength on resisted external rotation 4/5 L, 5/5 R.  Negative apprehension. NV intact distally.   Assessment & Plan:  1. L shoulder pain - patient may have mixed symptoms of trapezius spasm with rotator cuff tendinitis. MRI showed tendinopathy of supraspinatus and infraspinatus with partial  subscap tear.  Discussed options - not improving with physical therapy, home exercises, oral medications.  After discussion, patient agreed to L subacromial injection as opposed to trigger point injection.   After informed written consent timeout was performed, patient was seated in chair in exam room. Left shoulder was prepped with alcohol swab and utilizing lateral approach with ultrasound guidance, patient's left subacromial space was injected with 3:1 lidocaine: depomedrol. Patient tolerated the procedure well without immediate complications.   Patient will follow-up as needed, and may consider trigger point injections if no improvement.   Paulino Door, MS4 [facility]  of Medicine

## 2022-11-29 ENCOUNTER — Other Ambulatory Visit: Payer: Self-pay | Admitting: Internal Medicine

## 2022-11-29 DIAGNOSIS — Z1231 Encounter for screening mammogram for malignant neoplasm of breast: Secondary | ICD-10-CM

## 2023-01-14 ENCOUNTER — Ambulatory Visit
Admission: RE | Admit: 2023-01-14 | Discharge: 2023-01-14 | Disposition: A | Discharge status: Home / Self Care | Payer: BC Managed Care – PPO | Source: Ambulatory Visit | Attending: Internal Medicine | Admitting: Internal Medicine

## 2023-01-14 ENCOUNTER — Ambulatory Visit: Payer: BC Managed Care – PPO

## 2023-01-14 ENCOUNTER — Other Ambulatory Visit (HOSPITAL_BASED_OUTPATIENT_CLINIC_OR_DEPARTMENT_OTHER): Payer: Self-pay | Admitting: Internal Medicine

## 2023-01-14 DIAGNOSIS — E7841 Elevated Lipoprotein(a): Secondary | ICD-10-CM

## 2023-01-14 DIAGNOSIS — Z1231 Encounter for screening mammogram for malignant neoplasm of breast: Secondary | ICD-10-CM

## 2023-01-18 ENCOUNTER — Ambulatory Visit (HOSPITAL_COMMUNITY)
Admission: RE | Admit: 2023-01-18 | Discharge: 2023-01-18 | Disposition: A | Discharge status: Home / Self Care | Payer: BC Managed Care – PPO | Source: Ambulatory Visit | Attending: Internal Medicine | Admitting: Internal Medicine

## 2023-01-18 DIAGNOSIS — E7841 Elevated Lipoprotein(a): Secondary | ICD-10-CM

## 2023-04-13 ENCOUNTER — Other Ambulatory Visit: Payer: Self-pay

## 2023-04-13 ENCOUNTER — Encounter (HOSPITAL_COMMUNITY): Payer: Self-pay

## 2023-04-13 ENCOUNTER — Emergency Department (HOSPITAL_COMMUNITY): Payer: BC Managed Care – PPO

## 2023-04-13 ENCOUNTER — Emergency Department (HOSPITAL_COMMUNITY)
Admission: EM | Admit: 2023-04-13 | Discharge: 2023-04-13 | Disposition: A | Discharge status: Home / Self Care | Payer: BC Managed Care – PPO | Attending: Emergency Medicine | Admitting: Emergency Medicine

## 2023-04-13 DIAGNOSIS — F419 Anxiety disorder, unspecified: Secondary | ICD-10-CM | POA: Diagnosis not present

## 2023-04-13 DIAGNOSIS — R202 Paresthesia of skin: Secondary | ICD-10-CM | POA: Diagnosis not present

## 2023-04-13 DIAGNOSIS — R112 Nausea with vomiting, unspecified: Secondary | ICD-10-CM | POA: Diagnosis not present

## 2023-04-13 DIAGNOSIS — E860 Dehydration: Secondary | ICD-10-CM | POA: Insufficient documentation

## 2023-04-13 DIAGNOSIS — Z20822 Contact with and (suspected) exposure to covid-19: Secondary | ICD-10-CM | POA: Diagnosis not present

## 2023-04-13 DIAGNOSIS — R5383 Other fatigue: Secondary | ICD-10-CM | POA: Diagnosis present

## 2023-04-13 DIAGNOSIS — I10 Essential (primary) hypertension: Secondary | ICD-10-CM | POA: Diagnosis not present

## 2023-04-13 LAB — CBC
HCT: 41.9 % (ref 36.0–46.0)
Hemoglobin: 13.8 g/dL (ref 12.0–15.0)
MCH: 28.9 pg (ref 26.0–34.0)
MCHC: 32.9 g/dL (ref 30.0–36.0)
MCV: 87.8 fL (ref 80.0–100.0)
Platelets: 309 10*3/uL (ref 150–400)
RBC: 4.77 MIL/uL (ref 3.87–5.11)
RDW: 13.4 % (ref 11.5–15.5)
WBC: 15.1 10*3/uL — ABNORMAL HIGH (ref 4.0–10.5)
nRBC: 0 % (ref 0.0–0.2)

## 2023-04-13 LAB — BASIC METABOLIC PANEL
Anion gap: 20 — ABNORMAL HIGH (ref 5–15)
BUN: 14 mg/dL (ref 6–20)
CO2: 21 mmol/L — ABNORMAL LOW (ref 22–32)
Calcium: 10 mg/dL (ref 8.9–10.3)
Chloride: 96 mmol/L — ABNORMAL LOW (ref 98–111)
Creatinine, Ser: 1.26 mg/dL — ABNORMAL HIGH (ref 0.44–1.00)
GFR, Estimated: 52 mL/min — ABNORMAL LOW (ref >60)
Glucose, Bld: 102 mg/dL — ABNORMAL HIGH (ref 70–99)
Potassium: 3.2 mmol/L — ABNORMAL LOW (ref 3.5–5.1)
Sodium: 137 mmol/L (ref 135–145)

## 2023-04-13 LAB — URINALYSIS, W/ REFLEX TO CULTURE (INFECTION SUSPECTED)
Bilirubin Urine: NEGATIVE
Glucose, UA: NEGATIVE mg/dL
Hgb urine dipstick: NEGATIVE
Ketones, ur: 20 mg/dL — AB
Leukocytes,Ua: NEGATIVE
Nitrite: NEGATIVE
Protein, ur: 30 mg/dL — AB
Specific Gravity, Urine: 1.02 (ref 1.005–1.030)
pH: 7 (ref 5.0–8.0)

## 2023-04-13 LAB — RESP PANEL BY RT-PCR (RSV, FLU A&B, COVID)  RVPGX2
Influenza A by PCR: NEGATIVE
Influenza B by PCR: NEGATIVE
Resp Syncytial Virus by PCR: NEGATIVE
SARS Coronavirus 2 by RT PCR: NEGATIVE

## 2023-04-13 LAB — TROPONIN I (HIGH SENSITIVITY)
Troponin I (High Sensitivity): 5 ng/L (ref <18)
Troponin I (High Sensitivity): 4 ng/L (ref <18)

## 2023-04-13 LAB — LACTIC ACID, PLASMA: Lactic Acid, Venous: 1.6 mmol/L (ref 0.5–1.9)

## 2023-04-13 LAB — PREGNANCY, URINE: Preg Test, Ur: NEGATIVE

## 2023-04-13 LAB — CK: Total CK: 52 U/L (ref 38–234)

## 2023-04-13 MED ORDER — LACTATED RINGERS IV BOLUS
1000.0000 mL | Freq: Once | INTRAVENOUS | Status: AC
  Administered 2023-04-13: 1000 mL via INTRAVENOUS

## 2023-04-13 MED ORDER — ONDANSETRON HCL 4 MG/2ML IJ SOLN
4.0000 mg | Freq: Once | INTRAMUSCULAR | Status: AC
  Administered 2023-04-13: 4 mg via INTRAVENOUS
  Filled 2023-04-13: qty 2

## 2023-04-13 MED ORDER — LACTATED RINGERS IV BOLUS
500.0000 mL | Freq: Once | INTRAVENOUS | Status: AC
  Administered 2023-04-13: 500 mL via INTRAVENOUS

## 2023-04-13 MED ORDER — LORAZEPAM 1 MG PO TABS
1.0000 mg | ORAL_TABLET | Freq: Once | ORAL | Status: AC
  Administered 2023-04-13: 1 mg via ORAL
  Filled 2023-04-13 (×2): qty 1

## 2023-04-13 NOTE — Discharge Instructions (Signed)
Rhonda Henderson:  Thank you for allowing [place] to take care of you today.  We hope you begin feeling better soon.  To-Do: Please follow-up with your primary doctor. Please return to the Emergency Department or call 911 if you experience chest pain, shortness of breath, severe pain, severe fever, altered mental status, or have any reason to think that you need emergency medical care.  Thank you again.  Hope you feel better soon.  Department of Emergency Medicine [facility]

## 2023-04-13 NOTE — ED Provider Notes (Signed)
[place] EMERGENCY DEPARTMENT AT [facility] Provider Note   HPI: Rhonda Henderson is a 47 year old female with a past medical history as below presenting today due to concerns of a possible panic attack.  She believes she is feeling more anxious today as they were celebrating the birthday of a family member who is turning 38.  She reports they were preparing for the party when she had sudden onset anxiety, pins-and-needles in her hands bilaterally.  She did not lose consciousness.  She did not have chest pain, shortness of breath, palpitations.  She denies a history of similar symptoms.  She reports the symptoms have since improved.  She also endorses feeling nauseous and having some vomiting with that.  She denies any abdominal pain.  She reports feeling "hot" which is what seems to prompt her symptoms.  She reports feeling nauseous at present but denies any chest pain, shortness of breath, palpitations at present.  Due to her symptoms she called EMS.  EMS reports she has been hemodynamically stable in transit.  She had a normal sugar and has been awake and alert.  No syncopal episodes.  Past Medical History:  Diagnosis Date   Arthritis    Hypertension     Past Surgical History:  Procedure Laterality Date   CESAREAN SECTION     RADIOLOGY WITH ANESTHESIA N/A 06/19/2022   Procedure: MRI WITH CERVICAL SPINE WITHOUT CONTRAST,LEFT SHOULDER WITHOUT CONTRAST;  Surgeon: Radiologist, Medication, MD;  Location: [facility] OR;  Service: Radiology;  Laterality: N/A;     Social History   Tobacco Use   Smoking status: Never   Smokeless tobacco: Never  Vaping Use   Vaping Use: Never used  Substance Use Topics   Alcohol use: Not Currently   Drug use: Not Currently      Review of Systems  A complete ROS was performed with pertinent positives/negatives noted in the HPI.   Vitals:   04/13/23 1715 04/13/23 1830  BP: 112/87 118/70  Pulse: 97 73  Resp: (!) 23 19  Temp:    SpO2: 100% 98%     Physical Exam Vitals and nursing note reviewed.  Constitutional:      General: She is not in acute distress.    Appearance: She is well-developed.  HENT:     Head: Normocephalic and atraumatic.  Eyes:     General: No visual field deficit.    Conjunctiva/sclera: Conjunctivae normal.  Cardiovascular:     Rate and Rhythm: Normal rate and regular rhythm.     Heart sounds: No murmur heard. Pulmonary:     Effort: Pulmonary effort is normal. No respiratory distress.     Breath sounds: Normal breath sounds.  Abdominal:     Palpations: Abdomen is soft.     Tenderness: There is no abdominal tenderness.  Musculoskeletal:        General: No swelling.     Cervical back: Neck supple.  Skin:    General: Skin is warm and dry.     Capillary Refill: Capillary refill takes less than 2 seconds.  Neurological:     Mental Status: She is alert.     Cranial Nerves: Cranial nerves 2-12 are intact. No cranial nerve deficit, dysarthria or facial asymmetry.     Sensory: Sensation is intact. No sensory deficit.     Motor: Motor function is intact. No weakness, tremor, atrophy, abnormal muscle tone or seizure activity.     Coordination: Coordination is intact. Coordination normal.  Psychiatric:  Mood and Affect: Mood normal.     Procedures  MDM:  Imaging/radiology results:  DG Chest Portable 1 View  Result Date: 04/13/2023 CLINICAL DATA:  Presyncope and elevated white blood count EXAM: PORTABLE CHEST 1 VIEW COMPARISON:  CT cardiac coronary artery calcium score January 18, 2023 FINDINGS: The heart size and mediastinal contours are within normal limits. No focal pulmonary opacity. No pleural effusion or pneumothorax. The visualized upper abdomen is unremarkable. No acute osseous abnormality. IMPRESSION: No acute cardiopulmonary abnormality. Electronically Signed   By: Jacob Moores M.D.   On: 04/13/2023 19:17    EKG results: ECG on my interpretation is normal sinus rhythm and rate, without  anatomical ischemia representing STEMI, New-onset Arrhythmia, or ischemic equivalent.    Lab results:  Results for orders placed or performed during the hospital encounter of 04/13/23 (from the past 24 hour(s))  Basic metabolic panel     Status: Abnormal   Collection Time: 04/13/23  4:51 PM  Result Value Ref Range   Sodium 137 135 - 145 mmol/L   Potassium 3.2 (L) 3.5 - 5.1 mmol/L   Chloride 96 (L) 98 - 111 mmol/L   CO2 21 (L) 22 - 32 mmol/L   Glucose, Bld 102 (H) 70 - 99 mg/dL   BUN 14 6 - 20 mg/dL   Creatinine, Ser 4.09 (H) 0.44 - 1.00 mg/dL   Calcium 81.1 8.9 - 91.4 mg/dL   GFR, Estimated 52 (L) >60 mL/min   Anion gap 20 (H) 5 - 15  CBC     Status: Abnormal   Collection Time: 04/13/23  4:51 PM  Result Value Ref Range   WBC 15.1 (H) 4.0 - 10.5 K/uL   RBC 4.77 3.87 - 5.11 MIL/uL   Hemoglobin 13.8 12.0 - 15.0 g/dL   HCT 78.2 95.6 - 21.3 %   MCV 87.8 80.0 - 100.0 fL   MCH 28.9 26.0 - 34.0 pg   MCHC 32.9 30.0 - 36.0 g/dL   RDW 08.6 57.8 - 46.9 %   Platelets 309 150 - 400 K/uL   nRBC 0.0 0.0 - 0.2 %  Troponin I (High Sensitivity)     Status: None   Collection Time: 04/13/23  4:51 PM  Result Value Ref Range   Troponin I (High Sensitivity) 5 <18 ng/L  Pregnancy, urine     Status: None   Collection Time: 04/13/23  5:36 PM  Result Value Ref Range   Preg Test, Ur NEGATIVE NEGATIVE  Urinalysis, w/ Reflex to Culture (Infection Suspected) -Urine, Clean Catch     Status: Abnormal   Collection Time: 04/13/23  5:36 PM  Result Value Ref Range   Specimen Source URINE, CLEAN CATCH    Color, Urine YELLOW YELLOW   APPearance HAZY (A) CLEAR   Specific Gravity, Urine 1.020 1.005 - 1.030   pH 7.0 5.0 - 8.0   Glucose, UA NEGATIVE NEGATIVE mg/dL   Hgb urine dipstick NEGATIVE NEGATIVE   Bilirubin Urine NEGATIVE NEGATIVE   Ketones, ur 20 (A) NEGATIVE mg/dL   Protein, ur 30 (A) NEGATIVE mg/dL   Nitrite NEGATIVE NEGATIVE   Leukocytes,Ua NEGATIVE NEGATIVE   RBC / HPF 0-5 0 - 5 RBC/hpf   WBC,  UA 0-5 0 - 5 WBC/hpf   Bacteria, UA RARE (A) NONE SEEN   Squamous Epithelial / HPF 6-10 0 - 5 /HPF   Mucus PRESENT    Hyaline Casts, UA PRESENT   CK     Status: None   Collection Time: 04/13/23  6:20 PM  Result Value Ref Range   Total CK 52 38 - 234 U/L  Lactic acid, plasma     Status: None   Collection Time: 04/13/23  6:20 PM  Result Value Ref Range   Lactic Acid, Venous 1.6 0.5 - 1.9 mmol/L  Troponin I (High Sensitivity)     Status: None   Collection Time: 04/13/23  6:20 PM  Result Value Ref Range   Troponin I (High Sensitivity) 4 <18 ng/L  Resp panel by RT-PCR (RSV, Flu A&B, Covid) Anterior Nasal Swab     Status: None   Collection Time: 04/13/23  6:22 PM   Specimen: Anterior Nasal Swab  Result Value Ref Range   SARS Coronavirus 2 by RT PCR NEGATIVE NEGATIVE   Influenza A by PCR NEGATIVE NEGATIVE   Influenza B by PCR NEGATIVE NEGATIVE   Resp Syncytial Virus by PCR NEGATIVE NEGATIVE     Key medications administered in the ER:  Medications  lactated ringers bolus 1,000 mL (0 mLs Intravenous Stopped 04/13/23 1940)  LORazepam (ATIVAN) tablet 1 mg (1 mg Oral Given 04/13/23 1735)  ondansetron (ZOFRAN) injection 4 mg (4 mg Intravenous Given 04/13/23 1726)  lactated ringers bolus 500 mL (0 mLs Intravenous Stopped 04/13/23 2013)   Medical decision making: -Vital signs stable. Patient afebrile, hemodynamically stable, and non-toxic appearing. -Patient's presentation is most consistent with acute complicated illness / injury requiring diagnostic workup.. CARABELLA WADDELL is a 47 y.o. female presenting to the emergency department with presyncopal symptoms.  Patient is concerned this could be a panic attack therefore gives one-time dose of Ativan.  Also appears to be possibly dehydrated therefore will give fluids and she is complaining of nausea therefore will give Zofran.  Patient is presenting with diaphoresis and pins-and-needles with concerns that she had a panic attack.  On initial  evaluation patient is mildly tachycardic but otherwise hemodynamically stable.  EKG is without STEMI or ischemic equivalent.  Initial EKG shows possible artifact versus arrhythmia.  Repeat EKG is normal therefore suspect artifact.  Patient does appear dehydrated therefore given fluids given feelings of lightheadedness and feeling warm in the setting of possible decreased p.o. intake.  Urine obtained is without UTI.  CBC shows leukocytosis.  Chest x-ray is without pneumonia on my interpretation.  BMP does show elevated anion gap with creatinine bump.  COVID and flu testing is negative.  CK is normal, lactic acid is normal, pregnancy test is negative.  Patient given additional fluids with suspicions that patient's presentation is consistent with dehydration/starvation ketosis.  She has ketonuria on urinalysis.  Cardiac workup pursued due to feelings of diaphoresis, EKG is without STEMI or ischemic equivalent, initial troponin normal at 5, repeat troponin normal at 4.  Delta troponin of 1.  Do not suspect ACS.  Patient reports feeling completely back to baseline on reassessment.  Given reassuring workup and symptomatic improvement I believe she is stable for discharge.  I discussed strict return precaution for ED return.  Discussed follow-up with PCP for reevaluation and repeat labs.  Patient discharged.   Medical Decision Making Amount and/or Complexity of Data Reviewed Labs: ordered. Decision-making details documented in ED Course. Radiology: ordered and independent interpretation performed. Decision-making details documented in ED Course. ECG/medicine tests: ordered and independent interpretation performed. Decision-making details documented in ED Course.  Risk Prescription drug management.     The plan for this patient was discussed with Dr. Rush Landmark, who voiced agreement and who oversaw evaluation and treatment of this patient.  Fredricka Bonine  Ericka Pontiff, MD Emergency Medicine, PGY-3  Note: Dragon  medical dictation software was used in the creation of this note.   Clinical Impression:  1. Dehydration     Rx / DC Orders ED Discharge Orders     None          Chase Caller, MD 04/13/23 2024    Tegeler, Canary Brim, MD 04/14/23 [phone]

## 2023-04-13 NOTE — ED Triage Notes (Addendum)
Pt to ED via EMS from home with c/o n/v and tingling in hands and feet. Per EMS pt stated she started to feel nauseous earlier today and then began to have some tingling in hands and feet. Pt reports she think she may be having a panic attack. Pt denies any new medications or external stressors. Denies CP, or SOB. Aox4 in triage, shaky and nervous. VSS. BGL 131, HR 100 Bp 134/60

## 2023-05-21 ENCOUNTER — Encounter: Payer: Self-pay | Admitting: Physical Medicine and Rehabilitation

## 2023-05-21 ENCOUNTER — Ambulatory Visit: Payer: BC Managed Care – PPO | Admitting: Physical Medicine and Rehabilitation

## 2023-05-21 DIAGNOSIS — M5441 Lumbago with sciatica, right side: Secondary | ICD-10-CM | POA: Diagnosis not present

## 2023-05-21 DIAGNOSIS — M5416 Radiculopathy, lumbar region: Secondary | ICD-10-CM

## 2023-05-21 DIAGNOSIS — G8929 Other chronic pain: Secondary | ICD-10-CM | POA: Diagnosis not present

## 2023-05-21 DIAGNOSIS — M47816 Spondylosis without myelopathy or radiculopathy, lumbar region: Secondary | ICD-10-CM | POA: Diagnosis not present

## 2023-05-21 MED ORDER — TIZANIDINE HCL 4 MG PO TABS: 4.0000 mg | ORAL_TABLET | Freq: Four times a day (QID) | ORAL | 0 refills | Status: AC | PRN

## 2023-05-21 MED ORDER — MELOXICAM 15 MG PO TABS: 15.0000 mg | ORAL_TABLET | Freq: Every day | ORAL | 0 refills | Status: AC

## 2023-05-21 MED ORDER — DIAZEPAM 5 MG PO TABS: ORAL_TABLET | ORAL | 0 refills | Status: AC

## 2023-05-21 NOTE — Progress Notes (Signed)
Functional Pain Scale - descriptive words and definitions  Distracting (5)    Aware of pain/able to complete some ADL's but limited by pain/sleep is affected and active distractions are only slightly useful. Moderate range order  Average Pain 5 or greater  Lower back pain on the left that radiates down the side of the left leg

## 2023-05-21 NOTE — Progress Notes (Signed)
Rhonda Henderson - 47 y.o. female MRN [id number]  Date of birth: 1976/03/08  Office Visit Note: Visit Date: 05/21/2023 PCP: Georgianne Fick, MD Referred by: Fatima Sanger, FNP  Subjective: Chief Complaint  Patient presents with   Lower Back - Pain   HPI: Rhonda Henderson is a 47 y.o. female who comes in today per the request of Erby Pian, NP for evaluation of chronic, worsening and severe bilateral lower back pain radiating down right lateral leg to foot. Pain ongoing for several years, feels her pain is constant, no aggravating factors. She describes as soreness, numbness and tingling sensation, currently rates as 8 out of 10. Some relief of pain with home exercise regimen, rest and use of medications. History of formal physical therapy with minimal relief of pain.  Lumbar MRI imaging from 2020 exhibits degenerative changes at the level of L4-L5 with associated disc bulging and reactive endplate changes. No high grade spinal canal stenosis noted. History of multiple lumbar epidural steroid injections in our office many years ago with minimal relief of pain. Patient works as Runner, broadcasting/film/video at [employer]. Patient denies focal weakness. No recent trauma or falls.     Oswestry Disability Index Score: 20 to 30 (60%) severe disability: Pain remains the main problem in this group but activities of daily living are affected. These patients require a detailed investigation.  Review of Systems  Musculoskeletal:  Positive for back pain.  Neurological:  Positive for tingling. Negative for focal weakness and weakness.  All other systems reviewed and are negative.  Otherwise per HPI.  Assessment & Plan: Visit Diagnoses:    ICD-10-CM   1. Lumbar radiculopathy  M54.16 MR LUMBAR SPINE WO CONTRAST    2. Chronic bilateral low back pain with right-sided sciatica  M54.41    G89.29        Plan: Findings:  Chronic, worsening and severe bilateral lower back pain radiating down right lateral  leg to foot. Patient continues to have severe pain despite good conservative therapies such as formal physical therapy, home exercise regimen, rest and use of medications. Patients clinical presentation and exam are consistent with lumbar radiculopathy, more of L5 or S1 nerve pattern. I also feel there could be a myofascial component contributing to her pain as she does have tenderness to bilateral lumbar paraspinal regions on exam today. Next step is to obtain new lumbar MRI imaging. Depending on imaging we could look at performing lumbar epidural steroid injection. I discussed medication management today and prescribed Meloxicam and Tizanidine. She did voice issues with anxiety related to MRI imaging, I also prescribed pre-procedure Valium for her to take on day of imaging. I will see patient back for lumbar MRI review. No red flag symptoms noted on exam today.     Meds & Orders:  Meds ordered this encounter  Medications   diazepam (VALIUM) 5 MG tablet    Sig: Take one tablet by mouth with food one hour prior to procedure. May repeat 30 minutes prior if needed.    Dispense:  2 tablet    Refill:  0   meloxicam (MOBIC) 15 MG tablet    Sig: Take 1 tablet (15 mg total) by mouth daily.    Dispense:  30 tablet    Refill:  0   tiZANidine (ZANAFLEX) 4 MG tablet    Sig: Take 1 tablet (4 mg total) by mouth every 6 (six) hours as needed for muscle spasms.    Dispense:  30 tablet  Refill:  0    Orders Placed This Encounter  Procedures   MR LUMBAR SPINE WO CONTRAST    Follow-up: Return for lumbar MRI review.   Procedures: No procedures performed      Clinical History: Narrative & Impression CLINICAL DATA:  Initial evaluation for chronic centralized lower back pain radiating into the lateral aspects of both lower extremities with associated numbness.   EXAM: MRI LUMBAR SPINE WITHOUT CONTRAST   TECHNIQUE: Multiplanar, multisequence MR imaging of the lumbar spine was performed. No  intravenous contrast was administered.   COMPARISON:  None available.   FINDINGS: Segmentation: Standard. Lowest well-formed disc labeled the L5-S1 level.   Alignment: Mild levoscoliosis. Alignment otherwise normal with preservation of the normal lumbar lordosis. No listhesis.   Vertebrae: Vertebral body height maintained without evidence for acute or chronic fracture. Bone marrow signal intensity within normal limits. No discrete or worrisome osseous lesions. Chronic reactive endplate changes present about the L4-5 interspace. No other abnormal marrow edema.   Conus medullaris and cauda equina: Conus extends to the L1-2 level. Conus and cauda equina appear normal.   Paraspinal and other soft tissues: Paraspinous soft tissues within normal limits. Visualized visceral structures unremarkable.   Disc levels:   L1-2:  Unremarkable.   L2-3:  Unremarkable.   L3-4: Negative interspace. Mild bilateral facet hypertrophy. No canal or foraminal stenosis. No impingement.   L4-5: Chronic intervertebral disc space narrowing with diffuse disc bulge and disc desiccation. Reactive endplate changes with marginal endplate osteophytic spurring. Mild bilateral facet hypertrophy, slightly greater on the right. Associated trace bilateral joint effusions. No significant canal stenosis. Mild right L4 foraminal narrowing. No impingement.   L5-S1: Negative interspace. Mild left greater than right facet hypertrophy. No canal or foraminal stenosis. No impingement.   IMPRESSION: 1. Single level degenerative disc disease at L4-5 with associated disc bulging and reactive endplate changes, resulting in mild right L4 foraminal stenosis. No significant canal stenosis or neural impingement. 2. Mild bilateral facet hypertrophy at L3-4 through L5-S1. 3. Underlying mild levoscoliosis.     Electronically Signed   By: Rise Mu M.D.   On: 05/24/2019 15:12   She reports that she has never  smoked. She has never used smokeless tobacco. No results for input(s): "HGBA1C", "LABURIC" in the last 8760 hours.  Objective:  VS:  HT:    WT:   BMI:     BP:   HR: bpm  TEMP: ( )  RESP:  Physical Exam Vitals and nursing note reviewed.  HENT:     Head: Normocephalic and atraumatic.     Right Ear: External ear normal.     Left Ear: External ear normal.     Nose: Nose normal.     Mouth/Throat:     Mouth: Mucous membranes are moist.  Eyes:     Extraocular Movements: Extraocular movements intact.  Cardiovascular:     Rate and Rhythm: Normal rate.     Pulses: Normal pulses.  Pulmonary:     Effort: Pulmonary effort is normal.  Abdominal:     General: Abdomen is flat. There is no distension.  Musculoskeletal:        General: Tenderness present.     Cervical back: Normal range of motion.     Comments: Patient rises from seated position to standing without difficulty. Good lumbar range of motion. No pain noted with facet loading. 5/5 strength noted with bilateral hip flexion, knee flexion/extension, ankle dorsiflexion/plantarflexion and EHL. No clonus noted bilaterally. No pain upon  palpation of greater trochanters. No pain with internal/external rotation of bilateral hips. Sensation intact bilaterally. Dysesthesias noted to right L5 and S1 dermatomes. Tenderness noted upon palpation of bilateral lumbar paraspinal regions. Negative slump test bilaterally. Ambulates without aid, gait steady.     Skin:    General: Skin is warm and dry.     Capillary Refill: Capillary refill takes less than 2 seconds.  Neurological:     General: No focal deficit present.     Mental Status: She is alert and oriented to person, place, and time.  Psychiatric:        Mood and Affect: Mood normal.        Behavior: Behavior normal.     Ortho Exam  Imaging: No results found.  Past Medical/Family/Surgical/Social History: Medications & Allergies reviewed per EMR, new medications updated. Patient Active  Problem List   Diagnosis Date Noted   Acute pain of left shoulder 03/07/2022   Well woman exam 12/14/2013   Fibroids 12/11/2013   Obesity, morbid (HCC) 12/11/2013   Plantar wart of left foot 10/06/2013   Hypertension 01/15/2013   Back pain 01/04/2013   GERD (gastroesophageal reflux disease) 04/30/2012   Breast nodule 04/30/2012   Contraceptive management 01/23/2011   Seasonal allergies 01/23/2011   CONTACT DERMATITIS&OTHER ECZEMA DUE UNSPEC CAUSE 10/13/2010   ACANTHOSIS NIGRICANS 10/13/2010   Past Medical History:  Diagnosis Date   Arthritis    Hypertension    Family History  Problem Relation Age of Onset   Hypertension Mother    Diabetes Mother    Hypertension Father    Diabetes Father    Diabetes Sister    Cancer Maternal Aunt        breast   Breast cancer Maternal Aunt    Cancer Maternal Uncle        lung   Cancer Maternal Grandmother        stomach   Cancer Paternal Grandmother        thyroid   Cancer Paternal Grandfather        prostate   Past Surgical History:  Procedure Laterality Date   CESAREAN SECTION     RADIOLOGY WITH ANESTHESIA N/A 06/19/2022   Procedure: MRI WITH CERVICAL SPINE WITHOUT CONTRAST,LEFT SHOULDER WITHOUT CONTRAST;  Surgeon: Radiologist, Medication, MD;  Location: [facility] OR;  Service: Radiology;  Laterality: N/A;   Social History   Occupational History   Not on file  Tobacco Use   Smoking status: Never   Smokeless tobacco: Never  Vaping Use   Vaping status: Never Used  Substance and Sexual Activity   Alcohol use: Not Currently   Drug use: Not Currently   Sexual activity: Not on file

## 2023-05-29 ENCOUNTER — Encounter: Payer: Self-pay | Admitting: Physical Medicine and Rehabilitation

## 2023-06-09 ENCOUNTER — Other Ambulatory Visit: Payer: BC Managed Care – PPO

## 2023-07-02 IMAGING — MG MM DIGITAL SCREENING BILAT W/ TOMO AND CAD
6 of 10 series · 6 of 30 positions shown · non-contrast
Comparison: Previous exam(s).

CLINICAL DATA: Screening.

EXAM:
DIGITAL SCREENING BILATERAL MAMMOGRAM WITH TOMOSYNTHESIS AND CAD
TECHNIQUE: Bilateral screening digital craniocaudal and mediolateral oblique
mammograms were obtained. Bilateral screening digital breast
tomosynthesis was performed. The images were evaluated with
computer-aided detection.

[R CV synth-2D]
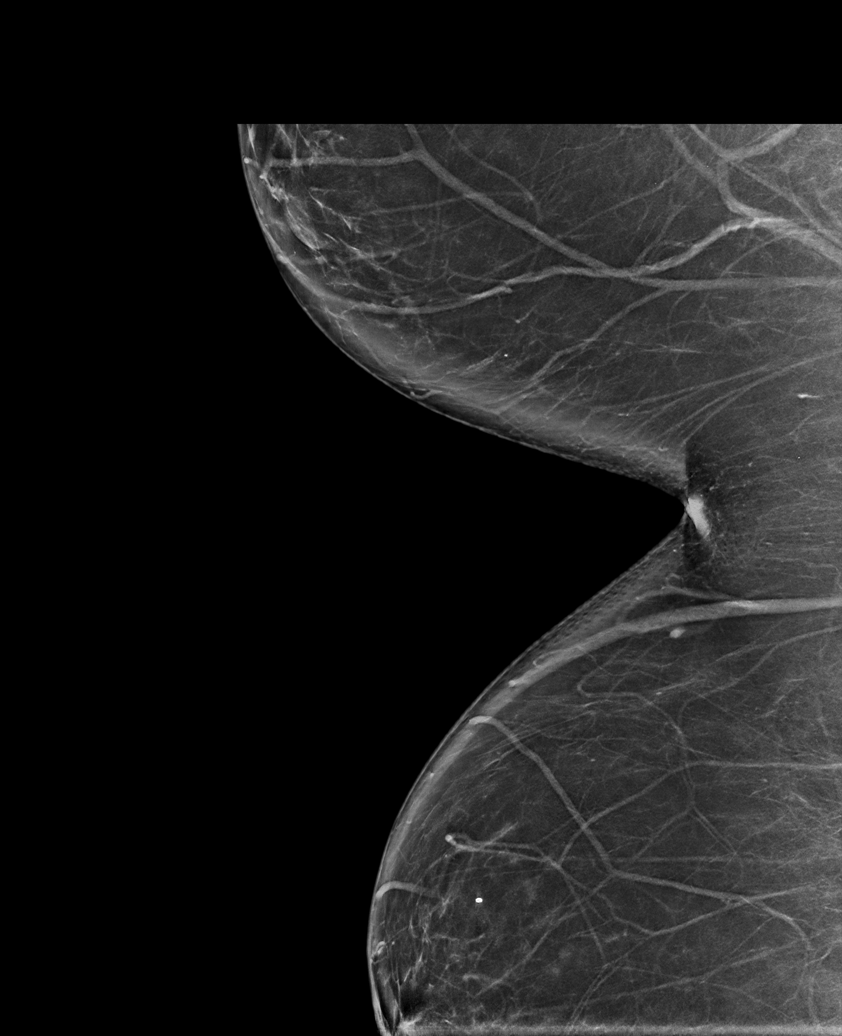

[L MLO synth-2D]
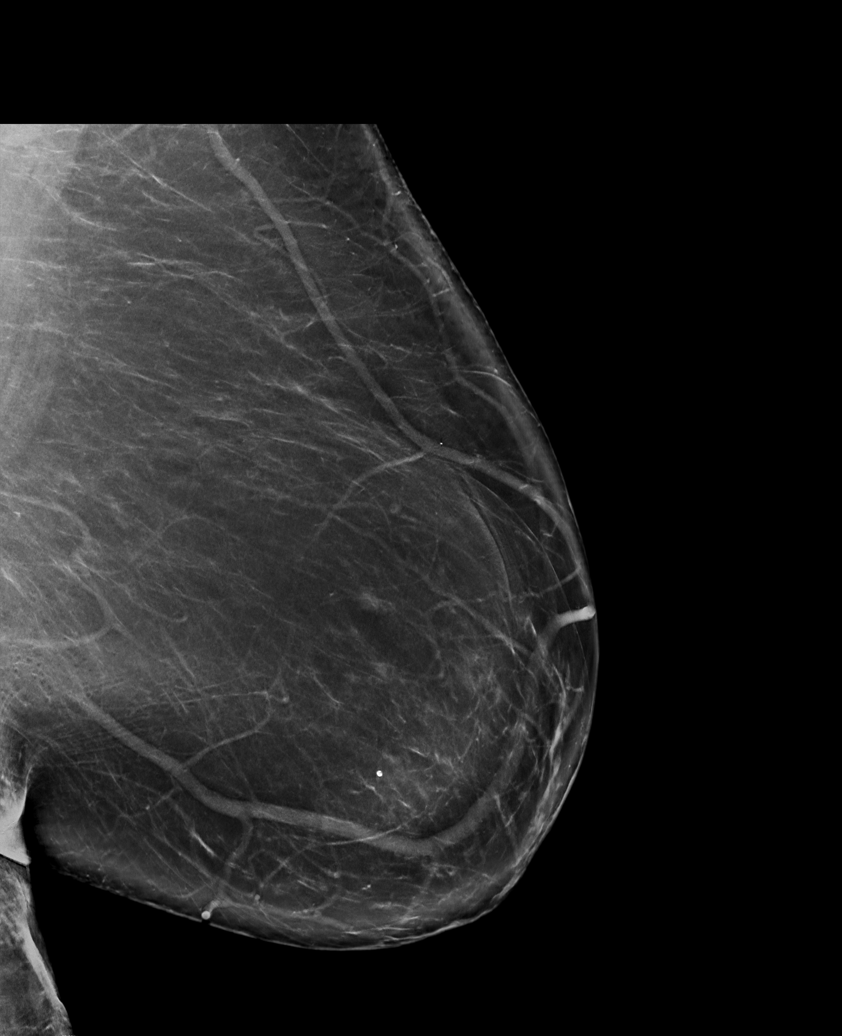

[L CC synth-2D]
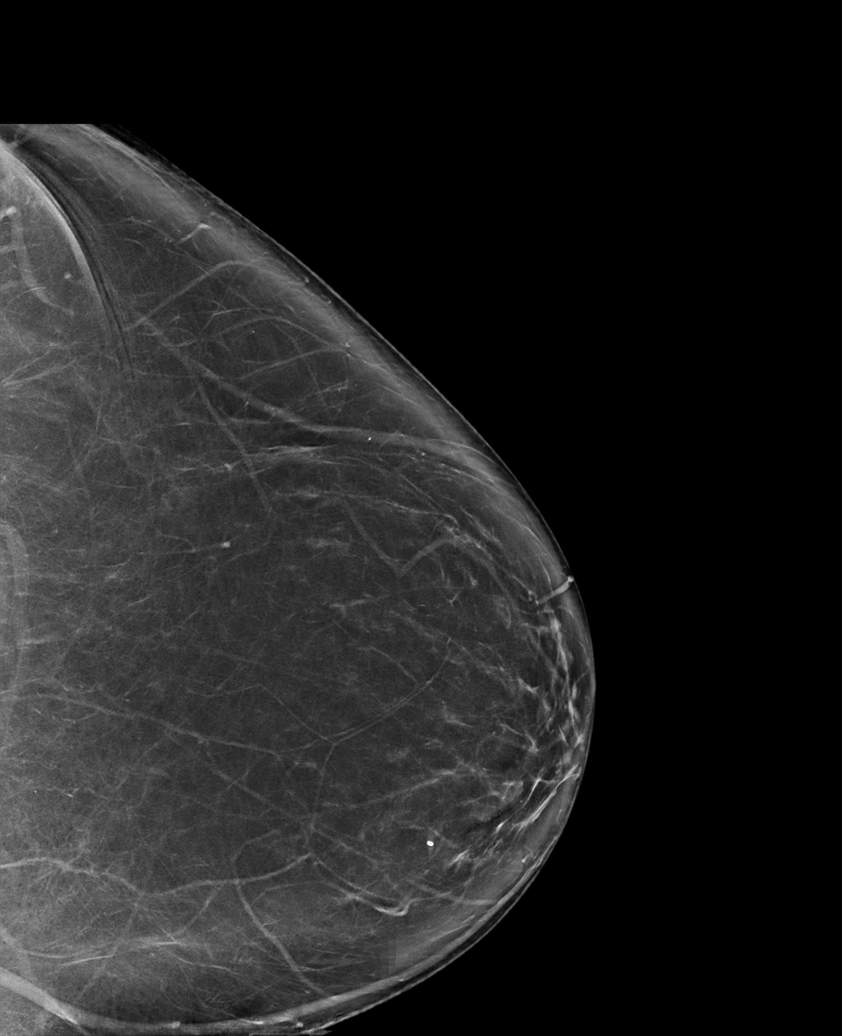

[R CC synth-2D]
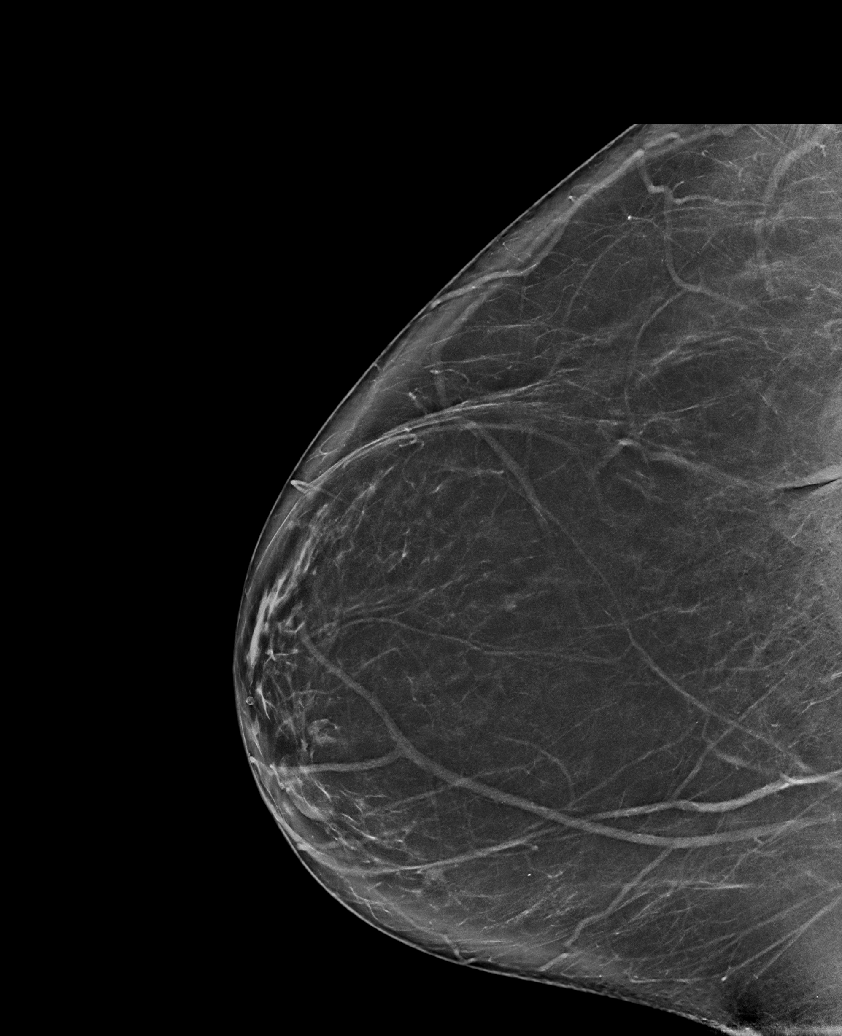

[R MLO synth-2D]
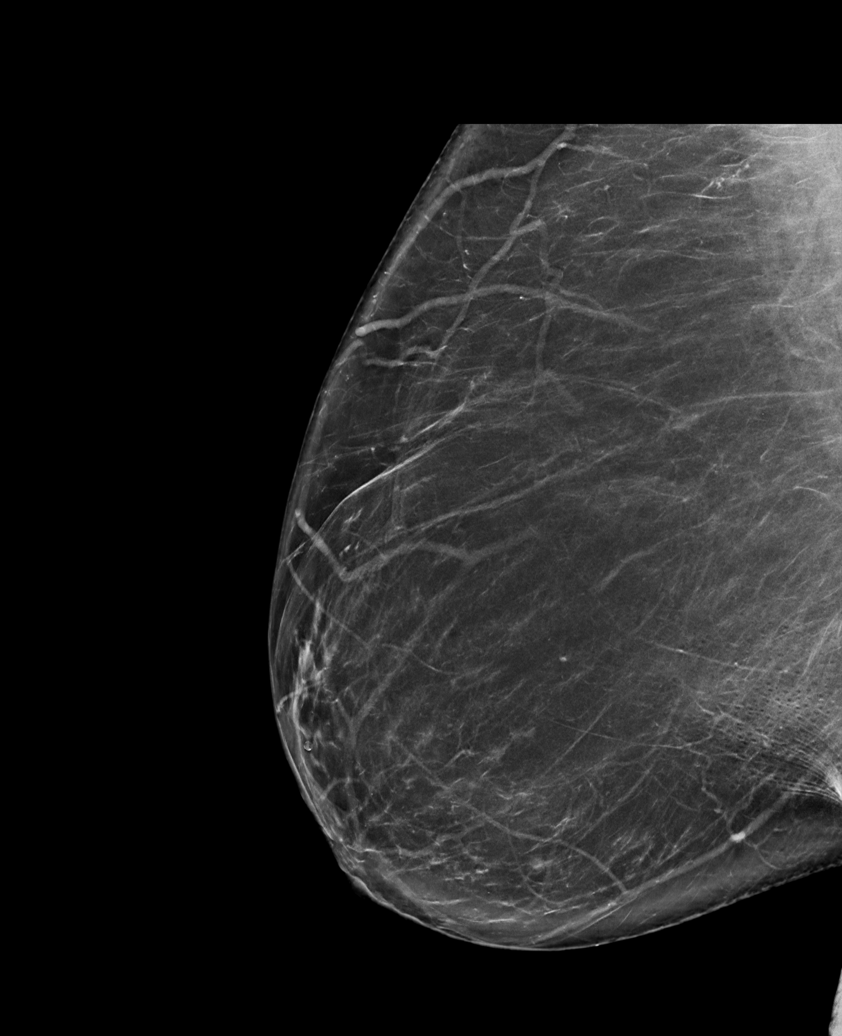

[R CV tomo · tomo slice 43/86.0]
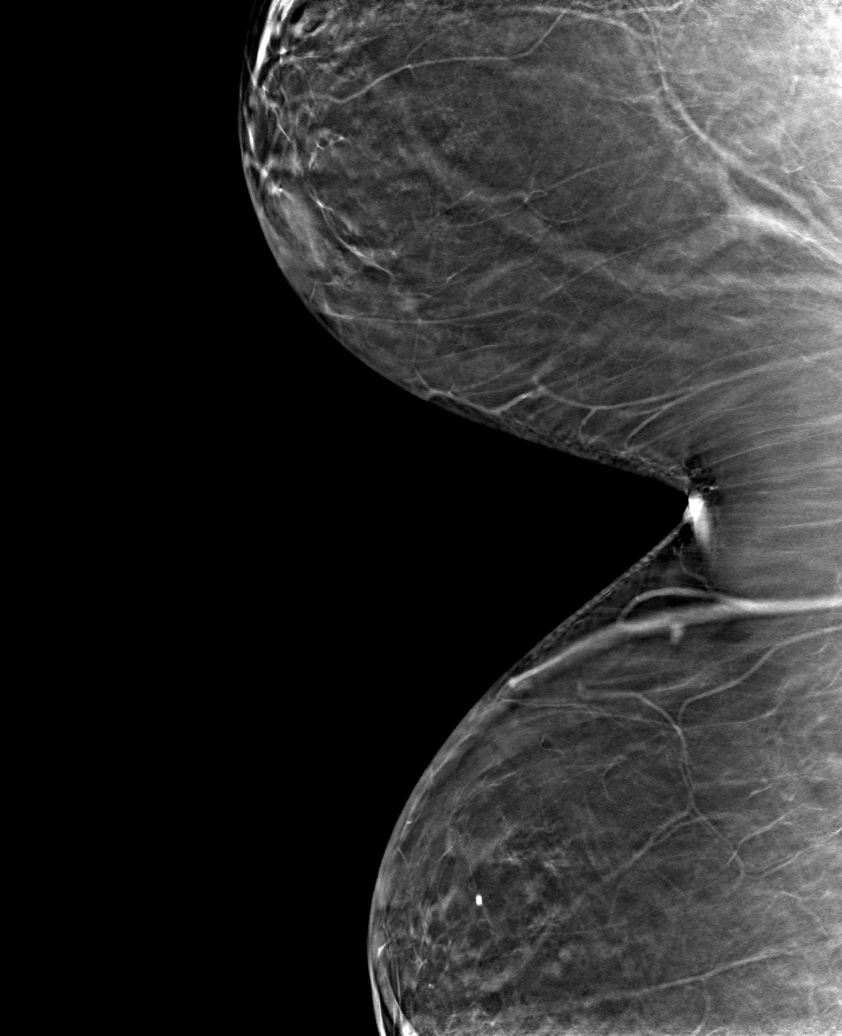

[6 of 30 positions shown; findings below may reference images not displayed]

ACR Breast Density Category b: There are scattered areas of
fibroglandular density.
FINDINGS: There are no findings suspicious for malignancy.
IMPRESSION: No mammographic evidence of malignancy. A result letter of this
screening mammogram will be mailed directly to the patient.

RECOMMENDATION:
Screening mammogram in one year. (Code:51-O-LD2)

BI-RADS CATEGORY  1: Negative.

## 2023-12-02 ENCOUNTER — Other Ambulatory Visit: Payer: Self-pay | Admitting: Physical Medicine and Rehabilitation

## 2023-12-27 ENCOUNTER — Other Ambulatory Visit: Payer: Self-pay | Admitting: Internal Medicine

## 2023-12-27 DIAGNOSIS — Z1231 Encounter for screening mammogram for malignant neoplasm of breast: Secondary | ICD-10-CM

## 2024-01-17 ENCOUNTER — Ambulatory Visit: Payer: Self-pay

## 2024-01-28 ENCOUNTER — Ambulatory Visit
Admission: RE | Admit: 2024-01-28 | Discharge: 2024-01-28 | Disposition: A | Discharge status: Home / Self Care | Payer: Self-pay | Source: Ambulatory Visit | Attending: Internal Medicine | Admitting: Internal Medicine

## 2024-01-28 DIAGNOSIS — Z1231 Encounter for screening mammogram for malignant neoplasm of breast: Secondary | ICD-10-CM

## 2024-01-31 ENCOUNTER — Other Ambulatory Visit: Payer: Self-pay | Admitting: Internal Medicine

## 2024-01-31 DIAGNOSIS — R928 Other abnormal and inconclusive findings on diagnostic imaging of breast: Secondary | ICD-10-CM

## 2024-02-12 ENCOUNTER — Ambulatory Visit
Admission: RE | Admit: 2024-02-12 | Discharge: 2024-02-12 | Disposition: A | Discharge status: Home / Self Care | Source: Ambulatory Visit | Attending: Internal Medicine | Admitting: Internal Medicine

## 2024-02-12 DIAGNOSIS — R928 Other abnormal and inconclusive findings on diagnostic imaging of breast: Secondary | ICD-10-CM

## 2024-02-13 ENCOUNTER — Other Ambulatory Visit: Payer: Self-pay | Admitting: Internal Medicine

## 2024-02-13 DIAGNOSIS — N6489 Other specified disorders of breast: Secondary | ICD-10-CM

## 2024-08-17 ENCOUNTER — Ambulatory Visit
Admission: RE | Admit: 2024-08-17 | Discharge: 2024-08-17 | Disposition: A | Discharge status: Home / Self Care | Source: Ambulatory Visit | Attending: Internal Medicine | Admitting: Internal Medicine

## 2024-08-17 ENCOUNTER — Encounter: Payer: Self-pay | Admitting: Radiology

## 2024-08-17 ENCOUNTER — Ambulatory Visit

## 2024-08-17 DIAGNOSIS — N6489 Other specified disorders of breast: Secondary | ICD-10-CM
